# Patient Record
Sex: Male | Born: 1960
Health system: Southern US, Community
[De-identification: ages and names within clinical notes are randomized; demographics above are authoritative.]

## PROBLEM LIST (undated history)

## (undated) DIAGNOSIS — R972 Elevated prostate specific antigen [PSA]: Secondary | ICD-10-CM

## (undated) DIAGNOSIS — N529 Male erectile dysfunction, unspecified: Secondary | ICD-10-CM

## (undated) DIAGNOSIS — E7800 Pure hypercholesterolemia, unspecified: Secondary | ICD-10-CM

## (undated) DIAGNOSIS — E785 Hyperlipidemia, unspecified: Secondary | ICD-10-CM

## (undated) DIAGNOSIS — J40 Bronchitis, not specified as acute or chronic: Secondary | ICD-10-CM

## (undated) HISTORY — DX: Elevated prostate specific antigen (PSA): R97.20

## (undated) HISTORY — DX: Male erectile dysfunction, unspecified: N52.9

## (undated) HISTORY — PX: OTHER SURGICAL HISTORY: SHX169

## (undated) HISTORY — DX: Pure hypercholesterolemia, unspecified: E78.00

## (undated) HISTORY — DX: Hyperlipidemia, unspecified: E78.5

## (undated) HISTORY — PX: COLONOSCOPY WITH PROPOFOL: SHX5780

## (undated) HISTORY — DX: Bronchitis, not specified as acute or chronic: J40

---

## 2011-03-31 DIAGNOSIS — J40 Bronchitis, not specified as acute or chronic: Secondary | ICD-10-CM

## 2011-03-31 HISTORY — DX: Bronchitis, not specified as acute or chronic: J40

## 2014-02-27 ENCOUNTER — Ambulatory Visit: Payer: Self-pay | Admitting: Gastroenterology

## 2014-03-02 LAB — PATHOLOGY REPORT

## 2015-06-28 ENCOUNTER — Encounter: Payer: Self-pay | Admitting: Physician Assistant

## 2015-06-28 ENCOUNTER — Ambulatory Visit: Payer: Self-pay | Admitting: Physician Assistant

## 2015-06-28 VITALS — BP 120/80 | HR 80 | Temp 98.5°F

## 2015-06-28 DIAGNOSIS — R05 Cough: Secondary | ICD-10-CM

## 2015-06-28 DIAGNOSIS — R059 Cough, unspecified: Secondary | ICD-10-CM

## 2015-06-28 MED ORDER — BENZONATATE 200 MG PO CAPS: 200.0000 mg | ORAL_CAPSULE | Freq: Two times a day (BID) | ORAL | Status: DC | PRN

## 2015-06-28 MED ORDER — ALBUTEROL SULFATE HFA 108 (90 BASE) MCG/ACT IN AERS: 2.0000 | INHALATION_SPRAY | Freq: Four times a day (QID) | RESPIRATORY_TRACT | Status: DC | PRN

## 2015-06-28 MED ORDER — FLUTICASONE PROPIONATE 50 MCG/ACT NA SUSP: 2.0000 | Freq: Every day | NASAL | Status: DC

## 2015-06-28 NOTE — Progress Notes (Signed)
S: C/o runny nose and congestion for 14 days, no fever, chills, cp/sob, v/d; mucus was clear this am and throughout the day, cough is sporadic, some wheezing at night when he lies down, hx of seasonal allergies in the fall, takes zyrtec at night, pcp recommended he double up with claritin during the day  Using otc meds: mucinex  O: PE: perrl eomi, normocephalic, tms dull, nasal mucosa red and swollen, throat injected, neck supple no lymph, lungs c t a, cv rrr, neuro intact  A:  Acute viral uri, seasonal allergies   P: drink fluids, continue regular meds , use otc meds of choice, return if not improving in 5 days, return earlier if worsening , tessalon perls, albuterol inhaler, flonase, saline nasal spray, switch to allegra instead of claritin

## 2015-07-29 ENCOUNTER — Encounter: Payer: Self-pay | Admitting: Urology

## 2015-07-29 ENCOUNTER — Other Ambulatory Visit: Payer: Self-pay

## 2015-07-29 ENCOUNTER — Ambulatory Visit (INDEPENDENT_AMBULATORY_CARE_PROVIDER_SITE_OTHER): Payer: 59 | Admitting: Urology

## 2015-07-29 VITALS — BP 123/76 | HR 75 | Ht 69.5 in | Wt 187.5 lb

## 2015-07-29 DIAGNOSIS — N529 Male erectile dysfunction, unspecified: Secondary | ICD-10-CM

## 2015-07-29 DIAGNOSIS — R972 Elevated prostate specific antigen [PSA]: Secondary | ICD-10-CM

## 2015-07-29 DIAGNOSIS — E7800 Pure hypercholesterolemia, unspecified: Secondary | ICD-10-CM | POA: Insufficient documentation

## 2015-07-29 DIAGNOSIS — N400 Enlarged prostate without lower urinary tract symptoms: Secondary | ICD-10-CM

## 2015-07-29 HISTORY — DX: Pure hypercholesterolemia, unspecified: E78.00

## 2015-07-29 HISTORY — DX: Male erectile dysfunction, unspecified: N52.9

## 2015-07-29 LAB — URINALYSIS, COMPLETE
BILIRUBIN UA: NEGATIVE
Glucose, UA: NEGATIVE
KETONES UA: NEGATIVE
LEUKOCYTES UA: NEGATIVE
Nitrite, UA: NEGATIVE
PH UA: 5.5 (ref 5.0–7.5)
PROTEIN UA: NEGATIVE
RBC UA: NEGATIVE
SPEC GRAV UA: 1.025 (ref 1.005–1.030)
Urobilinogen, Ur: 0.2 mg/dL (ref 0.2–1.0)

## 2015-07-29 LAB — MICROSCOPIC EXAMINATION
Bacteria, UA: NONE SEEN
RBC, UA: NONE SEEN /hpf (ref 0–?)

## 2015-07-29 NOTE — Progress Notes (Signed)
07/29/2015 1:49 PM   Tyrone Mccormick February 04, 1961 [id number]  Referring provider: Wayland Salinas, MD [address] [place], [place] [address]  Chief Complaint  Patient presents with  . New Patient (Initial Visit)    elevated PSA    HPI: The patient is a 54 year old gentleman presents for an elevated PSA.  The patient does have a family history of prostate cancer in his paternal cousin. Since of urination symptoms, his disease complaint is he sits to urinate when he stands up he leaks. He otherwise has no major issues with urination. He takes saw palmetto because he read that it "helps prostate symptoms." He does have erectile dysfunction. He currently takes 40 mg of generic sildenafil with a good response.  PSA: June 2015: 1.14 June 2015: 3.14 July 2015:4.3   PMH: Past Medical History  Diagnosis Date  . HLD (hyperlipidemia)   . Elevated PSA     Surgical History: Past Surgical History  Procedure Laterality Date  . None      Home Medications:    Medication List       This list is accurate as of: 07/29/15  1:49 PM.  Always use your most recent med list.               aspirin 81 MG tablet  Take 81 mg by mouth daily.     atorvastatin 20 MG tablet  Commonly known as:  LIPITOR  Take 20 mg by mouth daily.     cetirizine 10 MG tablet  Commonly known as:  ZYRTEC  Take 10 mg by mouth daily.     ibuprofen 200 MG tablet  Commonly known as:  ADVIL,MOTRIN  Take 200 mg by mouth every 6 (six) hours as needed.     MELATONIN PO  Take by mouth daily.     multivitamin tablet  Take 1 tablet by mouth daily.     saw palmetto 80 MG capsule  Take 80 mg by mouth 2 (two) times daily.     sildenafil 25 MG tablet  Commonly known as:  VIAGRA  Take 25 mg by mouth daily as needed for erectile dysfunction.        Allergies: No Known Allergies  Family History: Family History  Problem Relation Age of Onset  . Prostate cancer Cousin   .  Prostate cancer      grandfather    Social History:  reports that he has never smoked. He does not have any smokeless tobacco history on file. He reports that he drinks alcohol. He reports that he does not use illicit drugs.  ROS: UROLOGY Frequent Urination?: Yes Hard to postpone urination?: Yes Burning/pain with urination?: No Get up at night to urinate?: Yes Leakage of urine?: Yes Urine stream starts and stops?: No Trouble starting stream?: No Do you have to strain to urinate?: No Blood in urine?: No Urinary tract infection?: No Sexually transmitted disease?: No Injury to kidneys or bladder?: No Painful intercourse?: No Weak stream?: No Erection problems?: Yes Penile pain?: No  Gastrointestinal Nausea?: No Vomiting?: No Indigestion/heartburn?: No Diarrhea?: No Constipation?: No  Constitutional Fever: No Night sweats?: No Weight loss?: No Fatigue?: No  Skin Skin rash/lesions?: No Itching?: No  Eyes Blurred vision?: No Double vision?: No  Ears/Nose/Throat Sore throat?: No Sinus problems?: No  Hematologic/Lymphatic Swollen glands?: No Easy bruising?: No  Cardiovascular Leg swelling?: No Chest pain?: No  Respiratory Cough?: No Shortness of breath?: No  Endocrine Excessive thirst?: No  Musculoskeletal Back  pain?: No Joint pain?: No  Neurological Headaches?: No Dizziness?: No  Psychologic Depression?: No Anxiety?: No  Physical Exam: BP 123/76 mmHg  Pulse 75  Ht 5' 9.5" (1.765 m)  Wt 187 lb 8 oz (85.049 kg)  BMI 27.30 kg/m2  Constitutional:  Alert and oriented, No acute distress. HEENT: [place] AT, moist mucus membranes.  Trachea midline, no masses. Cardiovascular: No clubbing, cyanosis, or edema. Respiratory: Normal respiratory effort, no increased work of breathing. GI: Abdomen is soft, nontender, nondistended, no abdominal masses GU: No CVA tenderness.  Normal phallus. Testicles ascended bilaterally. Nontender palpation. DRE: 2+,  smooth, no nodules. Skin: No rashes, bruises or suspicious lesions. Lymph: No cervical or inguinal adenopathy. Neurologic: Grossly intact, no focal deficits, moving all 4 extremities. Psychiatric: Normal mood and affect.  Laboratory Data: No results found for: WBC, HGB, HCT, MCV, PLT  No results found for: CREATININE  No results found for: PSA  No results found for: TESTOSTERONE  No results found for: HGBA1C  Urinalysis No results found for: COLORURINE, APPEARANCEUR, LABSPEC, PHURINE, GLUCOSEU, HGBUR, BILIRUBINUR, KETONESUR, PROTEINUR, UROBILINOGEN, NITRITE, LEUKOCYTESUR   Assessment & Plan:    I discussed the patient the risks, benefits, and indications of prostate biopsy. He understands the risks include bleeding and infection. He understands that there'll be blood in his urine, semen, and and stool. I also discussed the risks, benefits, and indications of PSA testing. He is aware that is a controversial tests with some guidelines recommend against testing. He understands it may lead to the over diagnosis and over treatment of prostate cancer. He understands that he has increased risks because of his young age and his PSA doubling time being less than 18 months.  His current PSA is also 4.3. He had all questions answered. He has elected to undergo prostate biopsy.  1. Elevated PSA -Prostate biopsy  2. Erectile dysfunction Continue generic Benadryl 40 mg as prescribed by the PCP next  3. BPH without significant urinary symptoms No medical treatment indicated at this time.  Return in about 2 weeks (around 08/12/2015) for prostate biopsy.  Nickie Retort, MD  [facility] [address] [place], [place] [address] [phone]

## 2015-08-03 ENCOUNTER — Telehealth: Payer: Self-pay | Admitting: Urology

## 2015-08-03 NOTE — Telephone Encounter (Signed)
Patient notified that we received clearance from Dr. Owens Shark for him to stop ASA 81mg  7 days prior to prostate biopsy scheduled for 08-18-15@3 :30pm. Patient verbalized understanding and pre instructions were also reviewed again with him, patient understands

## 2015-08-03 NOTE — Telephone Encounter (Signed)
Left pt mess to call in regards to clearance for prostate biopsy

## 2015-08-18 ENCOUNTER — Ambulatory Visit (INDEPENDENT_AMBULATORY_CARE_PROVIDER_SITE_OTHER): Payer: 59 | Admitting: Urology

## 2015-08-18 ENCOUNTER — Other Ambulatory Visit: Payer: Self-pay | Admitting: Urology

## 2015-08-18 ENCOUNTER — Encounter: Payer: Self-pay | Admitting: Urology

## 2015-08-18 VITALS — BP 123/75 | HR 66 | Ht 69.0 in | Wt 192.8 lb

## 2015-08-18 DIAGNOSIS — R972 Elevated prostate specific antigen [PSA]: Secondary | ICD-10-CM | POA: Diagnosis not present

## 2015-08-18 MED ORDER — GENTAMICIN SULFATE 40 MG/ML IJ SOLN
80.0000 mg | Freq: Once | INTRAMUSCULAR | Status: AC
  Administered 2015-08-18: 80 mg via INTRAMUSCULAR

## 2015-08-18 MED ORDER — LEVOFLOXACIN 500 MG PO TABS
500.0000 mg | ORAL_TABLET | Freq: Once | ORAL | Status: AC
  Administered 2015-08-18: 500 mg via ORAL

## 2015-08-18 NOTE — Progress Notes (Signed)
Prostate Biopsy Procedure   Informed consent was obtained after discussing risks/benefits of the procedure.  A time out was performed to ensure correct patient identity.  Pre-Procedure: - Last PSA Level: 4.3 - Gentamicin given prophylactically - Levaquin 500 mg administered PO -Transrectal Ultrasound performed revealing a 47.91 gm prostate -No significant hypoechoic or median lobe noted  Procedure: - Prostate block performed using 10 cc 1% lidocaine and biopsies taken from sextant areas, a total of 12 under ultrasound guidance. -DRE post procedure with no significant bleeding or hematoma  Post-Procedure: - Patient tolerated the procedure well - He was counseled to seek immediate medical attention if experiences any severe pain, significant bleeding, or fevers - Return in one week to discuss biopsy results

## 2015-08-23 LAB — PATHOLOGY REPORT

## 2015-08-27 ENCOUNTER — Encounter: Payer: Self-pay | Admitting: Urology

## 2015-09-03 ENCOUNTER — Ambulatory Visit: Payer: 59

## 2016-02-18 ENCOUNTER — Other Ambulatory Visit: Payer: Self-pay

## 2016-02-18 DIAGNOSIS — R972 Elevated prostate specific antigen [PSA]: Secondary | ICD-10-CM

## 2016-02-21 ENCOUNTER — Other Ambulatory Visit: Payer: 59

## 2016-02-21 DIAGNOSIS — R972 Elevated prostate specific antigen [PSA]: Secondary | ICD-10-CM | POA: Diagnosis not present

## 2016-02-22 LAB — PSA: Prostate Specific Ag, Serum: 5.2 ng/mL — ABNORMAL HIGH (ref 0.0–4.0)

## 2016-02-24 ENCOUNTER — Ambulatory Visit: Payer: 59

## 2016-02-25 ENCOUNTER — Ambulatory Visit (INDEPENDENT_AMBULATORY_CARE_PROVIDER_SITE_OTHER): Payer: 59 | Admitting: Urology

## 2016-02-25 ENCOUNTER — Encounter: Payer: Self-pay | Admitting: Urology

## 2016-02-25 VITALS — BP 98/59 | HR 78 | Ht 69.75 in | Wt 189.5 lb

## 2016-02-25 DIAGNOSIS — R972 Elevated prostate specific antigen [PSA]: Secondary | ICD-10-CM | POA: Diagnosis not present

## 2016-02-25 NOTE — Progress Notes (Signed)
02/25/2016 4:13 PM   Tyrone Mccormick 1960-11-17 [id number]  Referring provider: Wayland Salinas, MD [address] [place], [place] [address]  Chief Complaint  Patient presents with  . Follow-up    6 month PSA 5.2    HPI: The patient is a 55 year old gentleman presents today for evaluation of elevated PSA. He did negative prostate biopsy in January 2017. His PSA has since risen to 5.2 from 4.3. He did have chronic inflammation noted on the prostate biopsy.   June 2015: 1.14 June 2015: 3.14 July 2015:4.07 February 2016: 5.2   PMH: Past Medical History  Diagnosis Date  . HLD (hyperlipidemia)   . Elevated PSA   . Bronchitis 03/31/2011  . ED (erectile dysfunction) of organic origin 07/29/2015  . Hypercholesterolemia 07/29/2015    Surgical History: Past Surgical History  Procedure Laterality Date  . None      Home Medications:    Medication List       This list is accurate as of: 02/25/16  4:13 PM.  Always use your most recent med list.               aspirin 81 MG tablet  Take 81 mg by mouth daily. Reported on 08/18/2015     atorvastatin 40 MG tablet  Commonly known as:  LIPITOR     cetirizine 10 MG tablet  Commonly known as:  ZYRTEC  Take 10 mg by mouth daily. Reported on 08/18/2015     ibuprofen 200 MG tablet  Commonly known as:  ADVIL,MOTRIN  Take 200 mg by mouth every 6 (six) hours as needed. Reported on 08/18/2015     MELATONIN PO  Take by mouth daily.     multivitamin tablet  Take 1 tablet by mouth daily.     saw palmetto 80 MG capsule  Take 80 mg by mouth 2 (two) times daily. Reported on 02/25/2016     sildenafil 25 MG tablet  Commonly known as:  VIAGRA  Take 25 mg by mouth daily as needed for erectile dysfunction.     tadalafil 20 MG tablet  Commonly known as:  CIALIS  Take by mouth. Reported on 02/25/2016        Allergies: No Known Allergies  Family History: Family History  Problem Relation Age of Onset  .  Prostate cancer Cousin   . Prostate cancer      grandfather  . Kidney disease Neg Hx     Social History:  reports that he has never smoked. He does not have any smokeless tobacco history on file. He reports that he drinks alcohol. He reports that he does not use illicit drugs.  ROS: UROLOGY Frequent Urination?: No Hard to postpone urination?: No Burning/pain with urination?: No Get up at night to urinate?: No Leakage of urine?: No Urine stream starts and stops?: No Trouble starting stream?: No Do you have to strain to urinate?: No Blood in urine?: No Urinary tract infection?: No Sexually transmitted disease?: No Injury to kidneys or bladder?: No Painful intercourse?: No Weak stream?: No Erection problems?: No Penile pain?: No  Gastrointestinal Nausea?: No Vomiting?: No Indigestion/heartburn?: No Diarrhea?: No Constipation?: No  Constitutional Fever: No Night sweats?: No Weight loss?: No Fatigue?: No  Skin Skin rash/lesions?: No Itching?: No  Eyes Blurred vision?: No Double vision?: No  Ears/Nose/Throat Sore throat?: No Sinus problems?: No  Hematologic/Lymphatic Swollen glands?: No Easy bruising?: No  Cardiovascular Leg swelling?: No Chest pain?: No  Respiratory Cough?: No Shortness  of breath?: No  Endocrine Excessive thirst?: No  Musculoskeletal Back pain?: No Joint pain?: No  Neurological Headaches?: No Dizziness?: No  Psychologic Depression?: No Anxiety?: No  Physical Exam: BP 98/59 mmHg  Pulse 78  Ht 5' 9.75" (1.772 m)  Wt 189 lb 8 oz (85.957 kg)  BMI 27.37 kg/m2  Constitutional:  Alert and oriented, No acute distress. HEENT: [place] AT, moist mucus membranes.  Trachea midline, no masses. Cardiovascular: No clubbing, cyanosis, or edema. Respiratory: Normal respiratory effort, no increased work of breathing. GI: Abdomen is soft, nontender, nondistended, no abdominal masses GU: No CVA tenderness.  Skin: No rashes, bruises or  suspicious lesions. Lymph: No cervical or inguinal adenopathy. Neurologic: Grossly intact, no focal deficits, moving all 4 extremities. Psychiatric: Normal mood and affect.  Laboratory Data: No results found for: WBC, HGB, HCT, MCV, PLT  No results found for: CREATININE  No results found for: PSA  No results found for: TESTOSTERONE  No results found for: HGBA1C  Urinalysis    Component Value Date/Time   APPEARANCEUR Clear 07/29/2015 1330   GLUCOSEU Negative 07/29/2015 1330   BILIRUBINUR Negative 07/29/2015 1330   PROTEINUR Negative 07/29/2015 1330   NITRITE Negative 07/29/2015 1330   LEUKOCYTESUR Negative 07/29/2015 1330      Assessment & Plan:    1. Elevated PSA 2. Rising PSA The patient's PSA continues to rise despite a negative prostate biopsy. We discussed options which include repeating the PSA in 6 months, endorectal MRI of the prostate with potential fusion prostate biopsy if suspicious area, or repeat prostate biopsy. The patient has elected to undergo an MRI of the prostate. I think this is very reasonable given in his previous negative prostate biopsy, young age, and rising PSA after this biopsy. He will follow-up after undergoing this procedure.    Return for after endorectal MRI in [place].  Nickie Retort, MD  [facility] [address] [place], [place] [address] [phone]

## 2016-03-22 ENCOUNTER — Ambulatory Visit (HOSPITAL_COMMUNITY)
Admission: RE | Admit: 2016-03-22 | Discharge: 2016-03-22 | Disposition: A | Discharge status: Home / Self Care | Payer: 59 | Source: Ambulatory Visit | Attending: Urology | Admitting: Urology

## 2016-03-22 DIAGNOSIS — R972 Elevated prostate specific antigen [PSA]: Secondary | ICD-10-CM | POA: Diagnosis not present

## 2016-03-22 DIAGNOSIS — R9721 Rising PSA following treatment for malignant neoplasm of prostate: Secondary | ICD-10-CM | POA: Diagnosis not present

## 2016-03-22 MED ORDER — GADOBENATE DIMEGLUMINE 529 MG/ML IV SOLN
17.0000 mL | Freq: Once | INTRAVENOUS | Status: AC | PRN
  Administered 2016-03-22: 17 mL via INTRAVENOUS

## 2016-04-03 ENCOUNTER — Telehealth: Payer: Self-pay | Admitting: Urology

## 2016-04-03 NOTE — Telephone Encounter (Signed)
Patient called and said that he went over and signed for his MRI results and that he knows that they are negative and that he is satisfied with them and doesn't feel that he needs to come in to discuss them. He doesn't have any questions about them. 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 He wants to cancel his results appointment. He said thank you for everything.   Sharyn Lull

## 2016-04-06 ENCOUNTER — Ambulatory Visit: Payer: 59

## 2016-04-06 NOTE — Telephone Encounter (Signed)
Tyrone Mccormick. Patient should still have annual PSA and DRE scheduled for August 2018.

## 2016-06-22 DIAGNOSIS — E7800 Pure hypercholesterolemia, unspecified: Secondary | ICD-10-CM | POA: Diagnosis not present

## 2016-06-22 DIAGNOSIS — Z1159 Encounter for screening for other viral diseases: Secondary | ICD-10-CM | POA: Diagnosis not present

## 2016-06-22 DIAGNOSIS — Z0000 Encounter for general adult medical examination without abnormal findings: Secondary | ICD-10-CM | POA: Diagnosis not present

## 2016-06-22 DIAGNOSIS — R972 Elevated prostate specific antigen [PSA]: Secondary | ICD-10-CM | POA: Diagnosis not present

## 2016-11-20 ENCOUNTER — Ambulatory Visit: Payer: Self-pay | Admitting: Physician Assistant

## 2016-11-20 ENCOUNTER — Ambulatory Visit
Admission: RE | Admit: 2016-11-20 | Discharge: 2016-11-20 | Disposition: A | Discharge status: Home / Self Care | Payer: 59 | Source: Ambulatory Visit | Attending: Physician Assistant | Admitting: Physician Assistant

## 2016-11-20 VITALS — BP 129/70 | HR 80 | Temp 97.7°F

## 2016-11-20 DIAGNOSIS — N451 Epididymitis: Secondary | ICD-10-CM | POA: Insufficient documentation

## 2016-11-20 DIAGNOSIS — N50812 Left testicular pain: Secondary | ICD-10-CM

## 2016-11-20 LAB — POCT URINALYSIS DIPSTICK
Bilirubin, UA: NEGATIVE
GLUCOSE UA: NEGATIVE
Ketones, UA: NEGATIVE
Leukocytes, UA: NEGATIVE
Nitrite, UA: NEGATIVE
PROTEIN UA: NEGATIVE
RBC UA: NEGATIVE
UROBILINOGEN UA: 0.2 U/dL
pH, UA: 6 (ref 5.0–8.0)

## 2016-11-20 MED ORDER — CIPROFLOXACIN HCL 500 MG PO TABS: 500.0000 mg | ORAL_TABLET | Freq: Two times a day (BID) | ORAL | 0 refills | Status: DC

## 2016-11-20 NOTE — Addendum Note (Signed)
Addended by: Versie Starks on: 11/20/2016 04:59 PM   Modules accepted: Orders

## 2016-11-20 NOTE — Progress Notes (Addendum)
S: c/o left testicular pain, states he awoke on Sunday with an enlarged testicle, on Saturday he did a lot of yard work and was bending and squatting while trimming some areas, no fever/chills, some low back pain, just knows it doesn't feel right  O: vitals wnl, left testicle is hard at lower pole, tender in inguinal canal, r testicle wnl, n/v intact  A: testicular pain  P: [place] of testicle, if neg for mass will refer to surgery for hernia US shows epididymitis; called in cipro 500mg  bid for 2 weeks

## 2016-12-11 ENCOUNTER — Ambulatory Visit: Payer: Self-pay | Admitting: Physician Assistant

## 2017-07-04 DIAGNOSIS — E7800 Pure hypercholesterolemia, unspecified: Secondary | ICD-10-CM | POA: Diagnosis not present

## 2017-07-04 DIAGNOSIS — Z0000 Encounter for general adult medical examination without abnormal findings: Secondary | ICD-10-CM | POA: Diagnosis not present

## 2017-07-04 DIAGNOSIS — R972 Elevated prostate specific antigen [PSA]: Secondary | ICD-10-CM | POA: Diagnosis not present

## 2017-07-04 DIAGNOSIS — Z79899 Other long term (current) drug therapy: Secondary | ICD-10-CM | POA: Diagnosis not present

## 2018-01-02 DIAGNOSIS — D179 Benign lipomatous neoplasm, unspecified: Secondary | ICD-10-CM | POA: Diagnosis not present

## 2018-01-02 DIAGNOSIS — L309 Dermatitis, unspecified: Secondary | ICD-10-CM | POA: Diagnosis not present

## 2018-01-30 DIAGNOSIS — L308 Other specified dermatitis: Secondary | ICD-10-CM | POA: Diagnosis not present

## 2018-01-30 DIAGNOSIS — L209 Atopic dermatitis, unspecified: Secondary | ICD-10-CM | POA: Diagnosis not present

## 2018-01-30 DIAGNOSIS — L400 Psoriasis vulgaris: Secondary | ICD-10-CM | POA: Diagnosis not present

## 2018-07-09 DIAGNOSIS — Z0000 Encounter for general adult medical examination without abnormal findings: Secondary | ICD-10-CM | POA: Diagnosis not present

## 2018-07-09 DIAGNOSIS — E7800 Pure hypercholesterolemia, unspecified: Secondary | ICD-10-CM | POA: Diagnosis not present

## 2018-07-09 DIAGNOSIS — Z23 Encounter for immunization: Secondary | ICD-10-CM | POA: Diagnosis not present

## 2018-07-09 DIAGNOSIS — Z79899 Other long term (current) drug therapy: Secondary | ICD-10-CM | POA: Diagnosis not present

## 2018-07-17 DIAGNOSIS — L308 Other specified dermatitis: Secondary | ICD-10-CM | POA: Diagnosis not present

## 2018-07-17 DIAGNOSIS — L738 Other specified follicular disorders: Secondary | ICD-10-CM | POA: Diagnosis not present

## 2018-12-04 ENCOUNTER — Ambulatory Visit: Payer: Self-pay | Admitting: Family Medicine

## 2019-07-17 ENCOUNTER — Other Ambulatory Visit: Payer: Self-pay

## 2019-07-17 ENCOUNTER — Other Ambulatory Visit
Admission: RE | Admit: 2019-07-17 | Discharge: 2019-07-17 | Disposition: A | Discharge status: Home / Self Care | Payer: No Typology Code available for payment source | Source: Ambulatory Visit | Attending: Internal Medicine | Admitting: Internal Medicine

## 2019-07-17 DIAGNOSIS — Z01812 Encounter for preprocedural laboratory examination: Secondary | ICD-10-CM | POA: Diagnosis present

## 2019-07-17 DIAGNOSIS — Z20828 Contact with and (suspected) exposure to other viral communicable diseases: Secondary | ICD-10-CM | POA: Insufficient documentation

## 2019-07-17 LAB — SARS CORONAVIRUS 2 (TAT 6-24 HRS): SARS Coronavirus 2: NEGATIVE

## 2019-07-18 ENCOUNTER — Encounter: Payer: Self-pay | Admitting: Internal Medicine

## 2019-07-21 ENCOUNTER — Other Ambulatory Visit: Payer: Self-pay

## 2019-07-21 ENCOUNTER — Ambulatory Visit: Payer: No Typology Code available for payment source | Admitting: Anesthesiology

## 2019-07-21 ENCOUNTER — Encounter
Admission: RE | Disposition: A | Discharge status: Home / Self Care | Payer: Self-pay | Source: Home / Self Care | Attending: Internal Medicine

## 2019-07-21 ENCOUNTER — Ambulatory Visit
Admission: RE | Admit: 2019-07-21 | Discharge: 2019-07-21 | Disposition: A | Discharge status: Home / Self Care | Payer: No Typology Code available for payment source | Attending: Internal Medicine | Admitting: Internal Medicine

## 2019-07-21 ENCOUNTER — Encounter: Payer: Self-pay | Admitting: Internal Medicine

## 2019-07-21 DIAGNOSIS — Z86010 Personal history of colonic polyps: Secondary | ICD-10-CM | POA: Insufficient documentation

## 2019-07-21 DIAGNOSIS — Z7982 Long term (current) use of aspirin: Secondary | ICD-10-CM | POA: Insufficient documentation

## 2019-07-21 DIAGNOSIS — E785 Hyperlipidemia, unspecified: Secondary | ICD-10-CM | POA: Diagnosis not present

## 2019-07-21 DIAGNOSIS — D122 Benign neoplasm of ascending colon: Secondary | ICD-10-CM | POA: Insufficient documentation

## 2019-07-21 DIAGNOSIS — K5730 Diverticulosis of large intestine without perforation or abscess without bleeding: Secondary | ICD-10-CM | POA: Diagnosis not present

## 2019-07-21 DIAGNOSIS — K635 Polyp of colon: Secondary | ICD-10-CM | POA: Insufficient documentation

## 2019-07-21 DIAGNOSIS — E7800 Pure hypercholesterolemia, unspecified: Secondary | ICD-10-CM | POA: Insufficient documentation

## 2019-07-21 DIAGNOSIS — K640 First degree hemorrhoids: Secondary | ICD-10-CM | POA: Insufficient documentation

## 2019-07-21 DIAGNOSIS — Z1211 Encounter for screening for malignant neoplasm of colon: Secondary | ICD-10-CM | POA: Diagnosis not present

## 2019-07-21 DIAGNOSIS — N529 Male erectile dysfunction, unspecified: Secondary | ICD-10-CM | POA: Diagnosis not present

## 2019-07-21 HISTORY — PX: COLONOSCOPY WITH PROPOFOL: SHX5780

## 2019-07-21 SURGERY — COLONOSCOPY WITH PROPOFOL
Anesthesia: General

## 2019-07-21 MED ORDER — PROPOFOL 10 MG/ML IV BOLUS
INTRAVENOUS | Status: DC | PRN
  Administered 2019-07-21: 60 mg via INTRAVENOUS
  Administered 2019-07-21: 30 mg via INTRAVENOUS

## 2019-07-21 MED ORDER — LIDOCAINE HCL (CARDIAC) PF 100 MG/5ML IV SOSY
PREFILLED_SYRINGE | INTRAVENOUS | Status: DC | PRN
  Administered 2019-07-21: 60 mg via INTRAVENOUS

## 2019-07-21 MED ORDER — PROPOFOL 500 MG/50ML IV EMUL
INTRAVENOUS | Status: DC | PRN
  Administered 2019-07-21: 120 ug/kg/min via INTRAVENOUS

## 2019-07-21 MED ORDER — SODIUM CHLORIDE 0.9 % IV SOLN
INTRAVENOUS | Status: DC
  Administered 2019-07-21: 1000 mL via INTRAVENOUS

## 2019-07-21 MED ORDER — PROPOFOL 10 MG/ML IV BOLUS
INTRAVENOUS | Status: AC
  Filled 2019-07-21: qty 20

## 2019-07-21 NOTE — H&P (Signed)
Outpatient short stay form Pre-procedure 07/21/2019 1:32 PM Tyrone Mccormick K. Alice Reichert, M.D.  Primary Physician: Orpah Melter, M.D.  Reason for visit:  Personal hx of adenomatous colon polyp x 3 (July 2015 colonoscopy).  History of present illness:                            Patient presents for colonoscopy for a personal hx of colon polyps. The patient denies abdominal pain, abnormal weight loss or rectal bleeding.     Current Facility-Administered Medications:  .  0.9 %  sodium chloride infusion, , Intravenous, Continuous, Kimmi Acocella, Benay Pike, MD  Medications Prior to Admission  Medication Sig Dispense Refill Last Dose  . aspirin 81 MG tablet Take 81 mg by mouth daily. Reported on 08/18/2015   Past Week at Unknown time  . atorvastatin (LIPITOR) 40 MG tablet   3 Past Week at Unknown time  . cetirizine (ZYRTEC) 10 MG tablet Take 10 mg by mouth daily. Reported on 08/18/2015   Past Week at Unknown time  . MELATONIN PO Take by mouth daily.   07/20/2019 at Unknown time  . Multiple Vitamin (MULTIVITAMIN) tablet Take 1 tablet by mouth daily.   Past Week at Unknown time  . saw palmetto 80 MG capsule Take 80 mg by mouth 2 (two) times daily. Reported on 02/25/2016   Past Week at Unknown time  . ibuprofen (ADVIL,MOTRIN) 200 MG tablet Take 200 mg by mouth every 6 (six) hours as needed. Reported on 08/18/2015     . sildenafil (VIAGRA) 25 MG tablet Take 25 mg by mouth daily as needed for erectile dysfunction.        No Known Allergies   Past Medical History:  Diagnosis Date  . Bronchitis 03/31/2011  . ED (erectile dysfunction) of organic origin 07/29/2015  . Elevated PSA   . HLD (hyperlipidemia)   . Hypercholesterolemia 07/29/2015    Review of systems:  Otherwise negative.    Physical Exam  Gen: Alert, oriented. Appears stated age.  HEENT: [place]/AT. PERRLA. Lungs: CTA, no wheezes. CV: RR nl S1, S2. Abd: soft, benign, no masses. BS+ Ext: No edema. Pulses 2+    Planned procedures: Proceed with  colonoscopy. The patient understands the nature of the planned procedure, indications, risks, alternatives and potential complications including but not limited to bleeding, infection, perforation, damage to internal organs and possible oversedation/side effects from anesthesia. The patient agrees and gives consent to proceed.  Please refer to procedure notes for findings, recommendations and patient disposition/instructions.     Chi Garlow K. Alice Reichert, M.D. Gastroenterology 07/21/2019  1:32 PM

## 2019-07-21 NOTE — Anesthesia Preprocedure Evaluation (Signed)
Anesthesia Evaluation  Patient identified by MRN, date of birth, ID band Patient awake    Reviewed: Allergy & Precautions, H&P , NPO status , Patient's Chart, lab work & pertinent test results, reviewed documented beta blocker date and time   History of Anesthesia Complications (+) PROLONGED EMERGENCE and history of anesthetic complications  Airway Mallampati: I  TM Distance: >3 FB Neck ROM: full    Dental  (+) Dental Advidsory Given, Caps, Teeth Intact   Pulmonary neg pulmonary ROS,    Pulmonary exam normal        Cardiovascular Exercise Tolerance: Good negative cardio ROS Normal cardiovascular exam     Neuro/Psych negative neurological ROS  negative psych ROS   GI/Hepatic negative GI ROS, Neg liver ROS,   Endo/Other  negative endocrine ROS  Renal/GU negative Renal ROS  negative genitourinary   Musculoskeletal   Abdominal   Peds  Hematology negative hematology ROS (+)   Anesthesia Other Findings Past Medical History: 03/31/2011: Bronchitis 07/29/2015: ED (erectile dysfunction) of organic origin No date: Elevated PSA No date: HLD (hyperlipidemia) 07/29/2015: Hypercholesterolemia   Reproductive/Obstetrics negative OB ROS                             Anesthesia Physical Anesthesia Plan  ASA: II  Anesthesia Plan: General   Post-op Pain Management:    Induction: Intravenous  PONV Risk Score and Plan: 2 and Propofol infusion and TIVA  Airway Management Planned: Natural Airway and Nasal Cannula  Additional Equipment:   Intra-op Plan:   Post-operative Plan:   Informed Consent: I have reviewed the patients History and Physical, chart, labs and discussed the procedure including the risks, benefits and alternatives for the proposed anesthesia with the patient or authorized representative who has indicated his/her understanding and acceptance.     Dental Advisory Given  Plan  Discussed with: Anesthesiologist, CRNA and Surgeon  Anesthesia Plan Comments:         Anesthesia Quick Evaluation

## 2019-07-21 NOTE — Interval H&P Note (Signed)
History and Physical Interval Note:  07/21/2019 1:33 PM  Tyrone Mccormick  has presented today for surgery, with the diagnosis of PERSONAL HX.OF COLON POLYPS.  The various methods of treatment have been discussed with the patient and family. After consideration of risks, benefits and other options for treatment, the patient has consented to  Procedure(s): COLONOSCOPY WITH PROPOFOL (N/A) as a surgical intervention.  The patient's history has been reviewed, patient examined, no change in status, stable for surgery.  I have reviewed the patient's chart and labs.  Questions were answered to the patient's satisfaction.     [place], [place]

## 2019-07-21 NOTE — Op Note (Signed)
[facility] Patient Name: Tyrone Mccormick Procedure Date: 07/21/2019 2:10 PM MRN: [id number] Account #: [id number] Date of Birth: Feb 18, 1961 Admit Type: Outpatient Age: 58 Room: [facility] ENDO ROOM 1 Gender: Male Note Status: Finalized Procedure:             Colonoscopy Indications:           Surveillance: Personal history of adenomatous polyps                         on last colonoscopy > 5 years ago Providers:             Lorie Apley K. Alice Reichert MD, MD Referring MD:          No Local Md, MD (Referring MD) Medicines:             Propofol per Anesthesia Complications:         No immediate complications. Procedure:             Pre-Anesthesia Assessment:                        - The risks and benefits of the procedure and the                         sedation options and risks were discussed with the                         patient. All questions were answered and informed                         consent was obtained.                        - Patient identification and proposed procedure were                         verified prior to the procedure by the nurse. The                         procedure was verified in the procedure room.                        - ASA Grade Assessment: III - A patient with severe                         systemic disease.                        - After reviewing the risks and benefits, the patient                         was deemed in satisfactory condition to undergo the                         procedure.                        After obtaining informed consent, the colonoscope was                         passed under direct  vision. Throughout the procedure,                         the patient's blood pressure, pulse, and oxygen                         saturations were monitored continuously. The                         Colonoscope was introduced through the anus and                         advanced to the the cecum, identified by  appendiceal                         orifice and ileocecal valve. The colonoscopy was                         performed without difficulty. The patient tolerated                         the procedure well. The quality of the bowel                         preparation was good. The ileocecal valve, appendiceal                         orifice, and rectum were photographed. Findings:      The perianal and digital rectal examinations were normal. Pertinent       negatives include normal sphincter tone and no palpable rectal lesions.      Many small-mouthed diverticula were found in the sigmoid colon.      A 7 mm polyp was found in the ascending colon. The polyp was sessile.       The polyp was removed with a cold snare. Resection and retrieval were       complete.      A 4 mm polyp was found in the ascending colon. The polyp was sessile.       The polyp was removed with a jumbo cold forceps. Resection and retrieval       were complete.      Non-bleeding internal hemorrhoids were found during retroflexion. The       hemorrhoids were Grade I (internal hemorrhoids that do not prolapse).      The exam was otherwise without abnormality. Impression:            - Diverticulosis in the sigmoid colon.                        - One 7 mm polyp in the ascending colon, removed with                         a cold snare. Resected and retrieved.                        - One 4 mm polyp in the ascending colon, removed with                         a jumbo cold forceps. Resected  and retrieved.                        - Non-bleeding internal hemorrhoids.                        - The examination was otherwise normal. Recommendation:        - Patient has a contact number available for                         emergencies. The signs and symptoms of potential                         delayed complications were discussed with the patient.                         Return to normal activities tomorrow. Written                          discharge instructions were provided to the patient.                        - Resume previous diet.                        - Continue present medications.                        - Repeat colonoscopy in 5 years for surveillance.                        - Return to GI office PRN.                        - The findings and recommendations were discussed with                         the patient. Procedure Code(s):     --- Professional ---                        [id number], Colonoscopy, flexible; with removal of                         tumor(s), polyp(s), or other lesion(s) by snare                         technique                        45380, 44, Colonoscopy, flexible; with biopsy, single                         or multiple Diagnosis Code(s):     --- Professional ---                        K57.30, Diverticulosis of large intestine without                         perforation or abscess without bleeding  K63.5, Polyp of colon                        K64.0, First degree hemorrhoids                        Z86.010, Personal history of colonic polyps CPT copyright 2019 American Medical Association. All rights reserved. The codes documented in this report are preliminary and upon coder review may  be revised to meet current compliance requirements. Efrain Sella MD, MD 07/21/2019 2:38:08 PM This report has been signed electronically. Number of Addenda: 0 Note Initiated On: 07/21/2019 2:10 PM Scope Withdrawal Time: 0 hours 8 minutes 28 seconds  Total Procedure Duration: 0 hours 11 minutes 21 seconds  Estimated Blood Loss:  Estimated blood loss: none.      [facility]

## 2019-07-21 NOTE — Transfer of Care (Signed)
Immediate Anesthesia Transfer of Care Note  Patient: Tyrone Mccormick  Procedure(s) Performed: COLONOSCOPY WITH PROPOFOL (N/A )  Patient Location: PACU  Anesthesia Type:General  Level of Consciousness: sedated  Airway & Oxygen Therapy: Patient Spontanous Breathing and Patient connected to nasal cannula oxygen  Post-op Assessment: Report given to RN and Post -op Vital signs reviewed and stable  Post vital signs: Reviewed and stable  Last Vitals:  Vitals Value Taken Time  BP 106/74 07/21/19 1440  Temp 36.1 C 07/21/19 1440  Pulse 85 07/21/19 1440  Resp 20 07/21/19 1440  SpO2 97 % 07/21/19 1440    Last Pain:  Vitals:   07/21/19 1440  TempSrc: Temporal  PainSc: Asleep         Complications: No apparent anesthesia complications

## 2019-07-21 NOTE — Anesthesia Post-op Follow-up Note (Signed)
Anesthesia QCDR form completed.        

## 2019-07-22 ENCOUNTER — Encounter: Payer: Self-pay | Admitting: *Deleted

## 2019-07-22 NOTE — Anesthesia Postprocedure Evaluation (Signed)
Anesthesia Post Note  Patient: Tyrone Mccormick  Procedure(s) Performed: COLONOSCOPY WITH PROPOFOL (N/A )  Patient location during evaluation: Endoscopy Anesthesia Type: General Level of consciousness: awake and alert Pain management: pain level controlled Vital Signs Assessment: post-procedure vital signs reviewed and stable Respiratory status: spontaneous breathing, nonlabored ventilation, respiratory function stable and patient connected to nasal cannula oxygen Cardiovascular status: blood pressure returned to baseline and stable Postop Assessment: no apparent nausea or vomiting Anesthetic complications: no     Last Vitals:  Vitals:   07/21/19 1450 07/21/19 1500  BP: 111/83 120/83  Pulse: 86 82  Resp: 20 18  Temp:    SpO2: 97% 98%    Last Pain:  Vitals:   07/21/19 1500  TempSrc:   PainSc: 0-No pain                 Martha Clan

## 2019-07-23 LAB — SURGICAL PATHOLOGY

## 2020-02-16 ENCOUNTER — Other Ambulatory Visit: Payer: Self-pay | Admitting: Family Medicine

## 2020-03-17 ENCOUNTER — Ambulatory Visit (INDEPENDENT_AMBULATORY_CARE_PROVIDER_SITE_OTHER): Payer: 59 | Admitting: Dermatology

## 2020-03-17 ENCOUNTER — Other Ambulatory Visit: Payer: Self-pay

## 2020-03-17 DIAGNOSIS — L309 Dermatitis, unspecified: Secondary | ICD-10-CM

## 2020-03-17 MED ORDER — OTEZLA 30 MG PO TABS: 30.0000 mg | ORAL_TABLET | Freq: Two times a day (BID) | ORAL | 4 refills | Status: DC

## 2020-03-17 MED ORDER — TRIAMCINOLONE ACETONIDE 0.1 % EX CREA: 1.0000 "application " | TOPICAL_CREAM | CUTANEOUS | 2 refills | Status: DC

## 2020-03-17 MED ORDER — OTEZLA 30 MG PO TABS: 30.0000 mg | ORAL_TABLET | Freq: Two times a day (BID) | ORAL | 12 refills | Status: DC

## 2020-03-17 MED ORDER — EUCRISA 2 % EX OINT: 1.0000 "application " | TOPICAL_OINTMENT | CUTANEOUS | 3 refills | Status: DC

## 2020-03-17 NOTE — Progress Notes (Signed)
   Follow-Up Visit   Subjective  Tyrone Mccormick is a 59 y.o. male who presents for the following: Dermatitis (bil hands, R great toe, Eucrisa oint and TMC 0.1% cr, pt ran out of [place]).  The following portions of the chart were reviewed this encounter and updated as appropriate:  Tobacco  Allergies  Meds  Problems  Med Hx  Surg Hx  Fam Hx     Review of Systems:  No other skin or systemic complaints except as noted in HPI or Assessment and Plan.  Objective  Well appearing patient in no apparent distress; mood and affect are within normal limits.  A focused examination was performed including bil hands, R foot. Relevant physical exam findings are noted in the Assessment and Plan.  Objective  bil hands, feet: Fissures and peeling fingers,    Assessment & Plan  Hand dermatitis vs Psoriasis of hands - flared - with fissures and symptoms bil hands, feet Vs Psoriasis Both conditions exacerbated by requirements at work to gel in and out when entering and leaving a patients room.  BSA 2%  Cont Eucrisa oint qd/bid prn flares Cont TMC 0.1% cr qd prn more severe flares Cont Dove soap, and cerave cream  Start Duobrii qd/bid aa R great toe until improved, samples x 2 given Start Otezla 30mg  1 po qd, pt will titrate up to 30mg  sample x 1 [id number], exp 08/2021  Discussed if not continuing to do well or if wants more improvement may try Otezla  Side effects of Otezla (apremilast) include diarrhea, nausea, headache, upper respiratory infection, depression, and weight decrease (5-10%). It should only be taken by pregnant women after a discussion regarding risks and benefits with their doctor.  Topical steroids (such as triamcinolone, fluocinolone, fluocinonide, mometasone, clobetasol, halobetasol, betamethasone, hydrocortisone) can cause thinning and lightening of the skin if they are used for too long in the same area. Your physician has selected the right strength medicine for your  problem and area affected on the body. Please use your medication only as directed by your physician to prevent side effects.   Crisaborole (EUCRISA) 2 % OINT - bil hands, feet  triamcinolone cream (KENALOG) 0.1 % - bil hands, feet  Apremilast (OTEZLA) 30 MG TABS - bil hands, feet  Apremilast (OTEZLA) 30 MG TABS - bil hands, feet  Return in about 6 weeks (around 04/28/2020) for Hand derm vs psoriasis.  I, Othelia Pulling, RMA, am acting as scribe for Sarina Ser, MD .   Documentation: I have reviewed the above documentation for accuracy and completeness, and I agree with the above.  Sarina Ser, MD

## 2020-03-18 ENCOUNTER — Ambulatory Visit: Payer: No Typology Code available for payment source | Admitting: Dermatology

## 2020-03-22 ENCOUNTER — Encounter: Payer: Self-pay | Admitting: Dermatology

## 2020-04-02 ENCOUNTER — Other Ambulatory Visit: Payer: Self-pay | Admitting: Family Medicine

## 2020-04-02 DIAGNOSIS — Z125 Encounter for screening for malignant neoplasm of prostate: Secondary | ICD-10-CM | POA: Diagnosis not present

## 2020-04-02 DIAGNOSIS — E7800 Pure hypercholesterolemia, unspecified: Secondary | ICD-10-CM | POA: Diagnosis not present

## 2020-04-02 DIAGNOSIS — Z79899 Other long term (current) drug therapy: Secondary | ICD-10-CM | POA: Diagnosis not present

## 2020-04-02 DIAGNOSIS — Z0000 Encounter for general adult medical examination without abnormal findings: Secondary | ICD-10-CM | POA: Diagnosis not present

## 2020-04-06 ENCOUNTER — Telehealth: Payer: Self-pay | Admitting: Pharmacist

## 2020-04-06 ENCOUNTER — Other Ambulatory Visit: Payer: Self-pay | Admitting: Internal Medicine

## 2020-04-06 ENCOUNTER — Other Ambulatory Visit: Payer: Self-pay

## 2020-04-06 ENCOUNTER — Ambulatory Visit (HOSPITAL_BASED_OUTPATIENT_CLINIC_OR_DEPARTMENT_OTHER): Payer: 59 | Admitting: Pharmacist

## 2020-04-06 DIAGNOSIS — L309 Dermatitis, unspecified: Secondary | ICD-10-CM

## 2020-04-06 MED ORDER — OTEZLA 30 MG PO TABS: 30.0000 mg | ORAL_TABLET | Freq: Two times a day (BID) | ORAL | 4 refills | Status: DC

## 2020-04-06 MED FILL — OTEZLA 30 MG TABS: 30 | 30 days supply | Qty: 60 | Fill #0

## 2020-04-06 NOTE — Telephone Encounter (Signed)
Called patient to schedule an appointment for the [employer] [facility]. I was unable to reach the patient so I left a HIPAA-compliant message requesting that the patient return my call.   

## 2020-04-06 NOTE — Progress Notes (Signed)
°  S: Patient presents for review of their specialty medication therapy.  Patient is currently taking Otezla for atopic dermatitis. Patient is managed by Dr. Nehemiah Massed for this.   Adherence: reports taking samples currently. Adherence reported.   Efficacy: reports that the initial dosing has worked well for him.   Dosing:  Active psoriatic arthritis or plaque psoriasis (moderate to severe): Oral: Initial: 10 mg in the morning. Titrate upward by additional 10 mg per day on days 2 to 5 as follows: Day 2: 10 mg twice daily; Day 3: 10 mg in the morning and 20 mg in the evening; Day 4: 20 mg twice daily; Day 5: 20 mg in the morning and 30 mg in the evening. Maintenance dose: 30 mg twice daily starting on day 6  CrCl <30 mL/minute: Initial: 10 mg in the morning on days 1 to 3; titrate using morning doses only (skip evening doses) to 20 mg on days 4 and 5. Maintenance dose: 30 mg once daily in the morning starting on day 6.    Current adverse effects: Headache: none  GI upset: none  Weight loss: none  Neuropsychiatric effects: none   O:  No results found for: WBC, HGB, HCT, MCV, PLT    Chemistry   No results found for: NA, K, CL, CO2, BUN, CREATININE, GLU No results found for: CALCIUM, ALKPHOS, AST, ALT, BILITOT     A/P: 1. Medication review: patient is currently on [place] for atopic dermatitis and is tolerating well. Reviewed the medication including the following: apremilast inhibits phosphodiesterase 4 (PDE4) specific for cyclic adenosine monophosphate (cAMP) which results in increased intracellular cAMP levels and regulation of numerous inflammatory mediators (eg, decreased expression of nitric oxide synthase, TNF-alpha, and interleukin [IL]-23, as well as increased IL-10. Patient educated on purpose, proper use and potential adverse effects of Otezla. Possible adverse effects include weight loss, GI upset, headache, and mood changes. Renal function should be routinely monitored. Administer  without regard to food. Do not crush, chew, or split tablets. No recommendations for any changes at this time.   Benard Halsted, PharmD, [place] [phone]

## 2020-04-29 ENCOUNTER — Ambulatory Visit (INDEPENDENT_AMBULATORY_CARE_PROVIDER_SITE_OTHER): Payer: 59 | Admitting: Dermatology

## 2020-04-29 ENCOUNTER — Other Ambulatory Visit: Payer: Self-pay

## 2020-04-29 DIAGNOSIS — L409 Psoriasis, unspecified: Secondary | ICD-10-CM | POA: Diagnosis not present

## 2020-04-29 NOTE — Progress Notes (Signed)
   Follow-Up Visit   Subjective  Tyrone Mccormick is a 59 y.o. male who presents for the following: hand derm vs psoriasis (hands/feet, Otezla 15mg  q 3 days, Eucrisa qhs, TMC 0.1% cr prn severe flares, Duobrii samples to R great toe, much improved, no side effects from [place]). 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  Therefore he decrease his dose and take it only 3 days a week.  The following portions of the chart were reviewed this encounter and updated as appropriate:  Tobacco  Allergies  Meds  Problems  Med Hx  Surg Hx  Fam Hx     Review of Systems:  No other skin or systemic complaints except as noted in HPI or Assessment and Plan.  Objective  Well appearing patient in no apparent distress; mood and affect are within normal limits.  A focused examination was performed including hands, feet. Relevant physical exam findings are noted in the Assessment and Plan.  Objective  bil hands, feet: Fissures and scales bil thumbs   Assessment & Plan  Psoriasis -severe with fissures of the hands and digits causing issues with work and activities of daily living -Currently on systemic Otezla.   He is flared due to not taking an optimal dose because of concerns about immunosuppression and Covid. 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  His psoriasis was causing fissures on his hands and digits causing issues with work and activities of daily living.  Although he still has these issues its improved on his current treatment of systemic Otezla. Improved but flared and persistent up especially on the thumbs  Increase to Otezla 15mg -30mg  1 po qd Cont Eucrisa oint qd/bid  Cont TMC 0.1% cr  qd up to 5d/wk prn severe flares Cont Duobrii lotion qd 5d/wk to aa R great toe until clear  Pt states no s/e from [place]  Side effects of Otezla (apremilast) include diarrhea, nausea, headache, upper respiratory infection, depression, and weight decrease (5-10%). It should only be taken by pregnant women after a discussion regarding risks and benefits with their doctor.   Return in about 3 months (around 07/29/2020) for Psoriasis.  I, Othelia Pulling, RMA, am acting as scribe for Sarina Ser, MD .  Documentation: I have reviewed the above documentation for accuracy and completeness, and I agree with the above.  Sarina Ser, MD

## 2020-04-30 ENCOUNTER — Encounter: Payer: Self-pay | Admitting: Dermatology

## 2020-04-30 DIAGNOSIS — Z23 Encounter for immunization: Secondary | ICD-10-CM | POA: Diagnosis not present

## 2020-07-08 MED FILL — OTEZLA 30 MG TABS: 30 | 30 days supply | Qty: 60 | Fill #1

## 2020-08-10 MED FILL — OTEZLA 30 MG TABS: 30 | 30 days supply | Qty: 60 | Fill #2

## 2020-08-12 ENCOUNTER — Ambulatory Visit (INDEPENDENT_AMBULATORY_CARE_PROVIDER_SITE_OTHER): Payer: No Typology Code available for payment source | Admitting: Dermatology

## 2020-08-12 ENCOUNTER — Other Ambulatory Visit: Payer: Self-pay

## 2020-08-12 ENCOUNTER — Other Ambulatory Visit: Payer: Self-pay | Admitting: Dermatology

## 2020-08-12 DIAGNOSIS — L400 Psoriasis vulgaris: Secondary | ICD-10-CM | POA: Diagnosis not present

## 2020-08-12 DIAGNOSIS — L309 Dermatitis, unspecified: Secondary | ICD-10-CM | POA: Diagnosis not present

## 2020-08-12 MED ORDER — OTEZLA 30 MG PO TABS: 30.0000 mg | ORAL_TABLET | Freq: Two times a day (BID) | ORAL | 3 refills | Status: DC

## 2020-08-12 MED ORDER — EUCRISA 2 % EX OINT: 1.0000 "application " | TOPICAL_OINTMENT | CUTANEOUS | 3 refills | Status: DC

## 2020-08-12 MED ORDER — TRIAMCINOLONE ACETONIDE 0.1 % EX CREA: 1.0000 "application " | TOPICAL_CREAM | CUTANEOUS | 2 refills | Status: DC

## 2020-08-12 NOTE — Progress Notes (Signed)
   Follow-Up Visit   Subjective  Tyrone Mccormick is a 60 y.o. male who presents for the following: Psoriasis (Of the hand and fingers - much improved, no longer fissuring. Currently using Otezla 30mg  po QD instead of 15mg  po QD that he uses in the summer months. ). Patient has not experienced side effects while on the [place].   The following portions of the chart were reviewed this encounter and updated as appropriate:   Tobacco  Allergies  Meds  Problems  Med Hx  Surg Hx  Fam Hx     Review of Systems:  No other skin or systemic complaints except as noted in HPI or Assessment and Plan.  Objective  Well appearing patient in no apparent distress; mood and affect are within normal limits.  A focused examination was performed including the B/L hands. Relevant physical exam findings are noted in the Assessment and Plan.  Objective  B/L hands: Patch on the R 4th finger, otherwise clear. Tiny fissure on the L thumb.   Assessment & Plan  Plaque psoriasis B/L hands Much improved on oral systemic Otezla without side effects -   Psoriasis is a chronic non-curable, but treatable genetic/hereditary disease that may have other systemic features affecting other organ systems such as joints (Psoriatic Arthritis). It is associated with an increased risk of inflammatory bowel disease, heart disease, non-alcoholic fatty liver disease, and depression.    Continue Otezla 30mg  po QD. Side effects of Otezla (apremilast) include diarrhea, nausea, headache, upper respiratory infection, depression, and weight decrease (5-10%). It should only be taken by pregnant women after a discussion regarding risks and benefits with their doctor. Goal is control of skin condition, not cure.  The use of requires long term medication management, including periodic office visits.  Continue TMC 0.1% cream QD up to 5d/qk PRN severe flares.   Continue Eucrisa 2% ointment QD-BID PRN flares.   Continue CeraVe cream  throughout the day.   Apremilast (OTEZLA) 30 MG TABS - B/L hands  Hand dermatitis  Reordered Medications triamcinolone (KENALOG) 0.1 % Crisaborole (EUCRISA) 2 % OINT  Other Related Medications Apremilast (OTEZLA) 30 MG TABS Apremilast (OTEZLA) 30 MG TABS  Return in about 6 months (around 02/09/2021) for psoriasis follow up - Otezla.  , CMA, am acting as scribe for Tyrone Baltimore, MD .  Documentation: I have reviewed the above documentation for accuracy and completeness, and I agree with the above.  04/12/2021, MD

## 2020-08-24 ENCOUNTER — Encounter: Payer: Self-pay | Admitting: Dermatology

## 2020-09-15 MED FILL — OTEZLA 30 MG TABS: 30 | 30 days supply | Qty: 60 | Fill #0

## 2020-09-21 ENCOUNTER — Other Ambulatory Visit: Payer: Self-pay | Admitting: Family Medicine

## 2020-10-15 MED FILL — OTEZLA 30 MG TABS: 30 | 30 days supply | Qty: 60 | Fill #3

## 2020-11-02 ENCOUNTER — Other Ambulatory Visit (HOSPITAL_COMMUNITY): Payer: Self-pay

## 2020-11-09 ENCOUNTER — Other Ambulatory Visit (HOSPITAL_COMMUNITY): Payer: Self-pay

## 2020-11-09 MED FILL — Apremilast Tab 30 MG: ORAL | 30 days supply | Qty: 60 | Fill #0 | Status: CN

## 2020-11-10 ENCOUNTER — Other Ambulatory Visit (HOSPITAL_COMMUNITY): Payer: Self-pay

## 2020-11-15 ENCOUNTER — Other Ambulatory Visit (HOSPITAL_COMMUNITY): Payer: Self-pay

## 2020-11-19 ENCOUNTER — Other Ambulatory Visit: Payer: Self-pay

## 2020-11-19 MED FILL — Sildenafil Citrate Tab 20 MG: ORAL | 10 days supply | Qty: 40 | Fill #0 | Status: AC

## 2020-11-19 MED FILL — Triamcinolone Acetonide Cream 0.1%: CUTANEOUS | 10 days supply | Qty: 80 | Fill #0 | Status: AC

## 2020-12-20 ENCOUNTER — Other Ambulatory Visit (HOSPITAL_COMMUNITY): Payer: Self-pay

## 2021-01-13 ENCOUNTER — Other Ambulatory Visit: Payer: Self-pay

## 2021-01-13 ENCOUNTER — Encounter: Payer: Self-pay | Admitting: Urology

## 2021-01-13 ENCOUNTER — Ambulatory Visit (INDEPENDENT_AMBULATORY_CARE_PROVIDER_SITE_OTHER): Payer: No Typology Code available for payment source | Admitting: Urology

## 2021-01-13 VITALS — BP 126/74 | HR 77 | Ht 69.0 in | Wt 192.0 lb

## 2021-01-13 DIAGNOSIS — R8281 Pyuria: Secondary | ICD-10-CM

## 2021-01-13 DIAGNOSIS — R350 Frequency of micturition: Secondary | ICD-10-CM | POA: Diagnosis not present

## 2021-01-13 DIAGNOSIS — N401 Enlarged prostate with lower urinary tract symptoms: Secondary | ICD-10-CM | POA: Diagnosis not present

## 2021-01-13 LAB — BLADDER SCAN AMB NON-IMAGING: Scan Result: 48

## 2021-01-13 LAB — URINALYSIS, COMPLETE
Bilirubin, UA: NEGATIVE
Glucose, UA: NEGATIVE
Ketones, UA: NEGATIVE
Nitrite, UA: NEGATIVE
Protein,UA: NEGATIVE
RBC, UA: NEGATIVE
Specific Gravity, UA: 1.02 (ref 1.005–1.030)
Urobilinogen, Ur: 0.2 mg/dL (ref 0.2–1.0)
pH, UA: 6 (ref 5.0–7.5)

## 2021-01-13 LAB — MICROSCOPIC EXAMINATION: Bacteria, UA: NONE SEEN

## 2021-01-13 NOTE — Progress Notes (Signed)
01/13/2021 8:36 PM   Tyrone Mccormick [birth year] [id number]  Referring provider: Orpah Melter, MD [address] [place],   [address]  Chief Complaint  Patient presents with   Urinary Frequency    HPI: Tyrone Mccormick is a 60 y.o. male who presents for evaluation of lower urinary tract symptoms.  Previously seen by Dr. Pilar Jarvis for an elevated PSA Initially seen 07/29/2015 for a PSA of 4.3 Biopsy performed 08/18/2015 remarkable for a 48 g prostate with benign pathology with chronic inflammation On follow-up 02/2016 PSA was 5.2 and prostate MRI was performed 03/22/2016 which showed a 44 g prostate and no lesions suspicious for prostate cancer PSA has been as high as 5.9 but states it was checked by his PCP last week and was 4.5    The main reason for his visit today has been worsening lower urinary tract symptoms  over the last several months He has some decreased force and caliber of his urinary stream, nocturia x2 and urinary hesitancy IPSS 8/25 Denies dysuria, gross hematuria Denies flank, abdominal or pelvic pain On sildenafil for ED     PMH: Past Medical History:  Diagnosis Date   Bronchitis 03/31/2011   ED (erectile dysfunction) of organic origin 07/29/2015   Elevated PSA    HLD (hyperlipidemia)    Hypercholesterolemia 07/29/2015    Surgical History: Past Surgical History:  Procedure Laterality Date   COLONOSCOPY WITH PROPOFOL     COLONOSCOPY WITH PROPOFOL N/A 07/21/2019   Procedure: COLONOSCOPY WITH PROPOFOL;  Surgeon: Toledo, Benay Pike, MD;  Location: [facility] ENDOSCOPY;  Service: Gastroenterology;  Laterality: N/A;   None      Home Medications:  Allergies as of 01/13/2021   No Known Allergies      Medication List        Accurate as of January 13, 2021  8:36 PM. If you have any questions, ask your nurse or doctor.          aspirin 81 MG tablet Take 81 mg by mouth daily. Reported on 08/18/2015   atorvastatin 20 MG tablet Commonly known as:  LIPITOR TAKE 1 TABLET BY MOUTH ONCE DAILY What changed: Another medication with the same name was removed. Continue taking this medication, and follow the directions you see here. Changed by: Abbie Sons, MD   cetirizine 10 MG tablet Commonly known as: ZYRTEC Take 10 mg by mouth daily. Reported on 08/18/2015   Eucrisa 2 % Oint Generic drug: Crisaborole APPLY TO THE AFFECTED AREA(S) TOPICALLY AS DIRECTED ONCE TO TWICE A DAY ON HANDS AND FEET AS NEEDED FOR FLARES   ibuprofen 200 MG tablet Commonly known as: ADVIL Take 200 mg by mouth every 6 (six) hours as needed. Reported on 08/18/2015   MELATONIN PO Take by mouth daily.   multivitamin tablet Take 1 tablet by mouth daily.   Otezla 30 MG Tabs Generic drug: Apremilast TAKE 1 TABLET BY MOUTH 2 TIMES DAILY. What changed: Another medication with the same name was removed. Continue taking this medication, and follow the directions you see here. Changed by: Abbie Sons, MD   saw palmetto 80 MG capsule Take 80 mg by mouth 2 (two) times daily. Reported on 02/25/2016   sildenafil 20 MG tablet Commonly known as: REVATIO TAKE AS DIRECTED BY DOCTOR FOR ED   sildenafil 25 MG tablet Commonly known as: VIAGRA Take 25 mg by mouth daily as needed for erectile dysfunction.   triamcinolone cream 0.1 % Commonly known as: KENALOG APPLY  TO THE AFFECTED AREA(S) ON HANDS AND FEET DAILY AS DIRECTED UP TO 5 DAYS A WEEK AS NEEDED FOR FLARES. AVOID FACE, GROIN, UNDERARMS        Allergies: No Known Allergies  Family History: Family History  Problem Relation Age of Onset   Prostate cancer Cousin    Prostate cancer Other        grandfather   Kidney disease Neg Hx     Social History:  reports that he has never smoked. He has never used smokeless tobacco. He reports current alcohol use. He reports that he does not use drugs.   Physical Exam: BP 126/74   Pulse 77   Ht 5\' 9"  (1.753 m)   Wt 192 lb (87.1 kg)   BMI 28.35 kg/m    Constitutional:  Alert and oriented, No acute distress. HEENT: [place] AT, moist mucus membranes.  Trachea midline, no masses. Cardiovascular: No clubbing, cyanosis, or edema. Respiratory: Normal respiratory effort, no increased work of breathing. GU: Prostate 40 g, smooth without nodules Lymph: No cervical or inguinal lymphadenopathy. Skin: No rashes, bruises or suspicious lesions. Neurologic: Grossly intact, no focal deficits, moving all 4 extremities. Psychiatric: Normal mood and affect.  Laboratory Data:  Urinalysis    Component Value Date/Time   APPEARANCEUR Clear 01/13/2021 1518   GLUCOSEU Negative 01/13/2021 1518   BILIRUBINUR Negative 01/13/2021 1518   PROTEINUR Negative 01/13/2021 1518   UROBILINOGEN 0.2 11/20/2016 1600   NITRITE Negative 01/13/2021 1518   LEUKOCYTESUR 2+ (A) 01/13/2021 1518    Lab Results  Component Value Date   LABMICR See below: 01/13/2021   WBCUA 11-30 (A) 01/13/2021   RBCUA None seen 07/29/2015   LABEPIT 0-10 01/13/2021   BACTERIA None seen 01/13/2021      Assessment & Plan:    1.  BPH with LUTS Mild lower urinary tract symptoms which he states are not bothersome enough that he desires to manage medically Bladder scan PVR 48 mL Annual follow-up and instructed call earlier for worsening voiding symptoms or desire to start medication  2.  Pyuria Urinalysis today with 11-30 WBC No dysuria but does have urinary urgency Urine culture was ordered Will notify with results and further recommendations after review   Abbie Sons, MD  [facility] [address] [place], [place] [address] [phone]

## 2021-01-14 ENCOUNTER — Other Ambulatory Visit: Payer: Self-pay

## 2021-01-14 MED ORDER — ATORVASTATIN CALCIUM 20 MG PO TABS
1.0000 | ORAL_TABLET | Freq: Every day | ORAL | 1 refills | Status: DC
  Filled 2021-01-14: qty 90, 90d supply, fill #0
  Filled 2021-04-16: qty 90, 90d supply, fill #1

## 2021-01-16 LAB — CULTURE, URINE COMPREHENSIVE

## 2021-01-17 ENCOUNTER — Telehealth: Payer: Self-pay | Admitting: *Deleted

## 2021-01-17 NOTE — Telephone Encounter (Signed)
-----   Message from Abbie Sons, MD sent at 01/17/2021  7:18 AM EDT ----- Urine culture negative for infection.  Recommend repeat urinalysis 1 month

## 2021-01-17 NOTE — Telephone Encounter (Signed)
Notified patient as instructed, patient pleased °

## 2021-01-25 ENCOUNTER — Other Ambulatory Visit (HOSPITAL_COMMUNITY): Payer: Self-pay

## 2021-01-27 ENCOUNTER — Ambulatory Visit (INDEPENDENT_AMBULATORY_CARE_PROVIDER_SITE_OTHER): Payer: No Typology Code available for payment source | Admitting: Dermatology

## 2021-01-27 ENCOUNTER — Other Ambulatory Visit: Payer: Self-pay

## 2021-01-27 DIAGNOSIS — D1722 Benign lipomatous neoplasm of skin and subcutaneous tissue of left arm: Secondary | ICD-10-CM | POA: Diagnosis not present

## 2021-01-27 DIAGNOSIS — Z79899 Other long term (current) drug therapy: Secondary | ICD-10-CM | POA: Diagnosis not present

## 2021-01-27 DIAGNOSIS — D1800 Hemangioma unspecified site: Secondary | ICD-10-CM

## 2021-01-27 DIAGNOSIS — L409 Psoriasis, unspecified: Secondary | ICD-10-CM

## 2021-01-27 DIAGNOSIS — L918 Other hypertrophic disorders of the skin: Secondary | ICD-10-CM

## 2021-01-27 DIAGNOSIS — I781 Nevus, non-neoplastic: Secondary | ICD-10-CM

## 2021-01-27 DIAGNOSIS — L821 Other seborrheic keratosis: Secondary | ICD-10-CM

## 2021-01-27 DIAGNOSIS — D229 Melanocytic nevi, unspecified: Secondary | ICD-10-CM

## 2021-01-27 MED ORDER — OTEZLA 30 MG PO TABS
30.0000 mg | ORAL_TABLET | Freq: Every day | ORAL | 6 refills | Status: DC
  Filled 2021-01-27 – 2021-02-11 (×2): qty 60, 60d supply, fill #0

## 2021-01-27 NOTE — Progress Notes (Signed)
   Follow-Up Visit   Subjective  Tyrone Mccormick is a 60 y.o. male who presents for the following: Psoriasis (Bil hands, otezla 30mg  1 po qd, TMC 0.1% cr prn, Eucrisa oint prn flares, had a flare 2 days ago otherwise controlled, no s/e from [facility]) and check moles on back (Back x 2, no changes that pt is aware of).  The following portions of the chart were reviewed this encounter and updated as appropriate:   Tobacco  Allergies  Meds  Problems  Med Hx  Surg Hx  Fam Hx      Review of Systems:  No other skin or systemic complaints except as noted in HPI or Assessment and Plan.  Objective  Well appearing patient in no apparent distress; mood and affect are within normal limits.  All skin waist up examined.  bil hands Guttate patch R volar thumb, palms appear clear  L arm Rubbery nodule  face Dilated blood vessels face   Assessment & Plan   Melanocytic Nevi - Tan-brown and/or pink-flesh-colored symmetric macules and papules - Benign appearing on exam today - Observation - Call clinic for new or changing moles - Recommend daily use of broad spectrum spf 30+ sunscreen to sun-exposed areas.   Hemangiomas - Red papules - Discussed benign nature - Observe - Call for any changes  Seborrheic Keratoses - Stuck-on, waxy, tan-brown papules and/or plaques  - Benign-appearing - Discussed benign etiology and prognosis. - Observe - Call for any changes  Acrochordons (Skin Tags) - Fleshy, skin-colored pedunculated papules - Benign appearing.  - Observe. - If desired, they can be removed with an in office procedure that is not covered by insurance. - Please call the clinic if you notice any new or changing lesions.   Long term medication management.   Psoriasis bil hands  Psoriasis is a chronic non-curable, but treatable genetic/hereditary disease that may have other systemic features affecting other organ systems such as joints (Psoriatic Arthritis). It is associated  with an increased risk of inflammatory bowel disease, heart disease, non-alcoholic fatty liver disease, and depression.     Improved  Cont Otezla 30mg  1 po qd Cont TMC 0.1% cr qd up to 5d/wk prn flares, avoid f/g/a Cont Eucrisa oint qd/bid prn flares  Discussed Delos Haring, Opzeulra   Side effects of Otezla (apremilast) include diarrhea, nausea, headache, upper respiratory infection, depression, and weight decrease (5-10%). It should only be taken by pregnant women after a discussion regarding risks and benefits with their doctor. Goal is control of skin condition, not cure.  The use of Rutherford Nail requires long term medication management, including periodic office visits.   Apremilast (OTEZLA) 30 MG TABS - bil hands Take 1 tablet (30 mg total) by mouth daily.  Lipoma of left upper extremity L arm  Benign, observe  Telangiectasia face  Benign, discussed BBL  Return in about 6 months (around 07/29/2021) for Psoriasis f/u.  I, Othelia Pulling, RMA, am acting as scribe for Sarina Ser, MD . Documentation: I have reviewed the above documentation for accuracy and completeness, and I agree with the above.  Sarina Ser, MD

## 2021-01-27 NOTE — Patient Instructions (Signed)

## 2021-01-28 ENCOUNTER — Other Ambulatory Visit (HOSPITAL_COMMUNITY): Payer: Self-pay

## 2021-02-10 ENCOUNTER — Encounter: Payer: Self-pay | Admitting: Dermatology

## 2021-02-11 ENCOUNTER — Other Ambulatory Visit (HOSPITAL_COMMUNITY): Payer: Self-pay

## 2021-02-11 MED FILL — Apremilast Tab 30 MG: ORAL | 30 days supply | Qty: 60 | Fill #0 | Status: CN

## 2021-02-17 ENCOUNTER — Other Ambulatory Visit: Payer: Self-pay

## 2021-02-17 ENCOUNTER — Other Ambulatory Visit: Payer: No Typology Code available for payment source

## 2021-02-17 DIAGNOSIS — R8281 Pyuria: Secondary | ICD-10-CM

## 2021-02-17 LAB — URINALYSIS, COMPLETE
Bilirubin, UA: NEGATIVE
Glucose, UA: NEGATIVE
Ketones, UA: NEGATIVE
Nitrite, UA: NEGATIVE
Protein,UA: NEGATIVE
RBC, UA: NEGATIVE
Specific Gravity, UA: 1.02 (ref 1.005–1.030)
Urobilinogen, Ur: 1 mg/dL (ref 0.2–1.0)
pH, UA: 7 (ref 5.0–7.5)

## 2021-02-17 LAB — MICROSCOPIC EXAMINATION

## 2021-02-21 ENCOUNTER — Other Ambulatory Visit (HOSPITAL_COMMUNITY): Payer: Self-pay

## 2021-02-21 MED FILL — Apremilast Tab 30 MG: ORAL | 30 days supply | Qty: 60 | Fill #0 | Status: AC

## 2021-03-30 ENCOUNTER — Other Ambulatory Visit: Payer: Self-pay

## 2021-03-30 MED FILL — Sildenafil Citrate Tab 20 MG: ORAL | 3 days supply | Qty: 10 | Fill #0 | Status: AC

## 2021-04-08 ENCOUNTER — Other Ambulatory Visit (HOSPITAL_COMMUNITY): Payer: Self-pay

## 2021-04-12 ENCOUNTER — Other Ambulatory Visit (HOSPITAL_COMMUNITY): Payer: Self-pay

## 2021-04-18 ENCOUNTER — Other Ambulatory Visit: Payer: Self-pay

## 2021-05-16 ENCOUNTER — Other Ambulatory Visit (HOSPITAL_COMMUNITY): Payer: Self-pay

## 2021-05-16 ENCOUNTER — Telehealth: Payer: Self-pay | Admitting: Pharmacist

## 2021-05-16 NOTE — Telephone Encounter (Signed)
Called patient to schedule an appointment for the [facility]. I was unable to reach the patient so I left a HIPAA-compliant message requesting that the patient return my call.   Luke Van Ausdall, PharmD, BCACP, CPP Clinical Pharmacist [facility] [phone]  

## 2021-05-20 ENCOUNTER — Other Ambulatory Visit (HOSPITAL_COMMUNITY): Payer: Self-pay

## 2021-05-27 ENCOUNTER — Other Ambulatory Visit (HOSPITAL_COMMUNITY): Payer: Self-pay

## 2021-06-09 ENCOUNTER — Ambulatory Visit: Payer: No Typology Code available for payment source | Attending: Family Medicine

## 2021-06-09 ENCOUNTER — Other Ambulatory Visit: Payer: Self-pay

## 2021-06-09 DIAGNOSIS — R269 Unspecified abnormalities of gait and mobility: Secondary | ICD-10-CM | POA: Insufficient documentation

## 2021-06-09 NOTE — Therapy (Signed)
[place] MAIN [facility] SERVICES [address] [place], [place], [address] Phone: [phone]   Fax:  [phone]  Patient Details  Name: Tyrone Mccormick MRN: [id number] Date of Birth: 04-05-1961 Referring Provider:  Orpah Melter, MD  Encounter Date: 06/09/2021  PT Screening Form   Time: in__14:30____     Time out__14:55___     Complaint ___Left knee pain________ Past Medical Hx:  Bronchitis, ED, Elevated PSA, HLD, Hypercholesterolemia Injury Date:__Insidious onset approx 1 and 1/2 month ago____  Pain Scale: __none present currently; 7-8/10_________ Patient's phone number:  [phone]  Hx (this occurrence):   Patient reports left knee pain started about 1 1/2 month ago- No Mechanism of injury. 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   Pain:  Left medial aspect of knee. Describes as "Sore"- Current 0/10; worst=7-8/10; Intermittent pain Worse with prolonged sitting- stiffness   Patient presents with the following knee flex  R= 122 deg and Left = 112 deg * pain limited Palpation: Tenderness only along medial joint line.  Edema- present- Circumference measured 3 cm above tibial plateau- R: 35.2 and L= 37.3 cm  Patient presents wearing his copper fit knee brace and states he could tell it helped him make it through his work day.   Patient presents with antalgic gait - utilizing no device.        Assessment:  Due to length of symptoms and progressively worsening  depsite performing rest, ice, compression, and elevation per patient report; medial joint line pain, Lack of formal testing  - PT recommending further evaluation with need for referral to Orthopedic Specialist. Patient presents with intermittent left knee yet progressively worsening. He presents with some swelling Left medial knee and tenderness along joint line with pain limited walking.      Recommendations:    Comments: Patient instructed to  contact Dr. Orpah Melter to inform him of medical condition and request for possible Ortho consult for further evaluation of left knee pain.     [x]  Patient would benefit from an MD referral []  Patient would benefit from a full PT/OT/ SLP evaluation and treatment. []  No intervention recommended at this time.             Lewis Moccasin, PT 06/09/2021, 3:34 PM  [place] MAIN [facility] SERVICES [address] [place], [place], [address] Phone: [phone]   Fax:  [phone]

## 2021-06-14 ENCOUNTER — Other Ambulatory Visit (HOSPITAL_COMMUNITY): Payer: Self-pay

## 2021-06-14 ENCOUNTER — Other Ambulatory Visit: Payer: Self-pay

## 2021-06-14 ENCOUNTER — Ambulatory Visit: Payer: No Typology Code available for payment source | Attending: Family Medicine | Admitting: Pharmacist

## 2021-06-14 DIAGNOSIS — L409 Psoriasis, unspecified: Secondary | ICD-10-CM

## 2021-06-14 MED ORDER — OTEZLA 30 MG PO TABS
30.0000 mg | ORAL_TABLET | Freq: Every day | ORAL | 6 refills | Status: DC
  Filled 2021-06-14: qty 30, 30d supply, fill #0
  Filled 2021-09-19: qty 30, 30d supply, fill #1
  Filled 2021-11-22: qty 30, 30d supply, fill #2
  Filled 2021-12-09: qty 30, 30d supply, fill #3
  Filled 2022-03-06: qty 30, 30d supply, fill #4
  Filled 2022-04-25: qty 30, 30d supply, fill #5
  Filled 2022-06-01: qty 30, 30d supply, fill #6

## 2021-06-14 NOTE — Progress Notes (Signed)
  S: Patient presents for review of their specialty medication therapy.  Patient is currently taking Otezla for atopic dermatitis. Patient is managed by Dr. Kowalski for this.   Adherence: adherence reported  Efficacy: reports that it controls things well  Dosing:  Active psoriatic arthritis or plaque psoriasis (moderate to severe): Oral: Initial: 10 mg in the morning. Titrate upward by additional 10 mg per day on days 2 to 5 as follows: Day 2: 10 mg twice daily; Day 3: 10 mg in the morning and 20 mg in the evening; Day 4: 20 mg twice daily; Day 5: 20 mg in the morning and 30 mg in the evening. Maintenance dose: 30 mg twice daily starting on day 6  CrCl <30 mL/minute: Initial: 10 mg in the morning on days 1 to 3; titrate using morning doses only (skip evening doses) to 20 mg on days 4 and 5. Maintenance dose: 30 mg once daily in the morning starting on day 6.    Current adverse effects: Headache: none  GI upset: none  Weight loss: none  Neuropsychiatric effects: none   O:  No results found for: "WBC", "HGB", "HCT", "MCV", "PLT"    Chemistry   No results found for: "NA", "K", "CL", "CO2", "BUN", "CREATININE", "GLU" No results found for: "CALCIUM", "ALKPHOS", "AST", "ALT", "BILITOT"     A/P: 1. Medication review: patient is currently on Otezla for atopic dermatitis and is tolerating well. Reviewed the medication including the following: apremilast inhibits phosphodiesterase 4 (PDE4) specific for cyclic adenosine monophosphate (cAMP) which results in increased intracellular cAMP levels and regulation of numerous inflammatory mediators (eg, decreased expression of nitric oxide synthase, TNF-alpha, and interleukin [IL]-23, as well as increased IL-10. Patient educated on purpose, proper use and potential adverse effects of Otezla. Possible adverse effects include weight loss, GI upset, headache, and mood changes. Renal function should be routinely monitored. Administer without regard to  food. Do not crush, chew, or split tablets. No recommendations for any changes at this time.   Luke Van Ausdall, PharmD, BCACP, CPP Clinical Pharmacist [facility] [phone]      

## 2021-06-28 ENCOUNTER — Other Ambulatory Visit (HOSPITAL_COMMUNITY): Payer: Self-pay

## 2021-07-01 ENCOUNTER — Other Ambulatory Visit (HOSPITAL_COMMUNITY): Payer: Self-pay

## 2021-07-07 ENCOUNTER — Other Ambulatory Visit: Payer: Self-pay

## 2021-07-07 ENCOUNTER — Ambulatory Visit (INDEPENDENT_AMBULATORY_CARE_PROVIDER_SITE_OTHER): Payer: No Typology Code available for payment source | Admitting: Sports Medicine

## 2021-07-07 ENCOUNTER — Ambulatory Visit (INDEPENDENT_AMBULATORY_CARE_PROVIDER_SITE_OTHER): Payer: No Typology Code available for payment source

## 2021-07-07 DIAGNOSIS — M1712 Unilateral primary osteoarthritis, left knee: Secondary | ICD-10-CM | POA: Insufficient documentation

## 2021-07-07 DIAGNOSIS — M25562 Pain in left knee: Secondary | ICD-10-CM | POA: Diagnosis not present

## 2021-07-07 DIAGNOSIS — G8929 Other chronic pain: Secondary | ICD-10-CM

## 2021-07-07 DIAGNOSIS — Z09 Encounter for follow-up examination after completed treatment for conditions other than malignant neoplasm: Secondary | ICD-10-CM | POA: Diagnosis not present

## 2021-07-07 MED ORDER — MELOXICAM 15 MG PO TABS
ORAL_TABLET | ORAL | 3 refills | Status: DC
  Filled 2021-07-07: qty 30, 30d supply, fill #0

## 2021-07-07 NOTE — Progress Notes (Signed)
    Procedures performed today:    None.  Independent interpretation of notes and tests performed by another provider:   None.  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 He also gets minimal gelling. No discrete trauma though he has been fairly active and athletic through his life. On exam he has only trace effusion, he does have moderate tenderness at the medial joint line, pain with terminal flexion, ligamentous structures are otherwise stable. I think he likely has a degenerative meniscal tear, we will start conservatively as he is not having any overt locking, buckling, catching, switching from ibuprofen to meloxicam, reaction knee brace, x-rays, home conditioning, return to see me in 6 weeks, injection +/- MRI if no better.  Chronic process with exacerbation and pharmacologic intervention  ___________________________________________ Gwen Her. Dianah Field, M.D., ABFM., CAQSM. Primary Care and [place] Instructor of [employer] of [facility] of Medicine

## 2021-07-07 NOTE — Assessment & Plan Note (Signed)
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 He also gets minimal gelling. No discrete trauma though he has been fairly active and athletic through his life. On exam he has only trace effusion, he does have moderate tenderness at the medial joint line, pain with terminal flexion, ligamentous structures are otherwise stable. I think he likely has a degenerative meniscal tear, we will start conservatively as he is not having any overt locking, buckling, catching, switching from ibuprofen to meloxicam, reaction knee brace, x-rays, home conditioning, return to see me in 6 weeks, injection +/- MRI if no better.

## 2021-07-13 ENCOUNTER — Ambulatory Visit: Payer: No Typology Code available for payment source | Admitting: Dermatology

## 2021-07-13 ENCOUNTER — Other Ambulatory Visit: Payer: Self-pay

## 2021-07-13 ENCOUNTER — Other Ambulatory Visit (HOSPITAL_COMMUNITY): Payer: Self-pay

## 2021-07-13 DIAGNOSIS — L409 Psoriasis, unspecified: Secondary | ICD-10-CM | POA: Diagnosis not present

## 2021-07-13 DIAGNOSIS — I781 Nevus, non-neoplastic: Secondary | ICD-10-CM | POA: Diagnosis not present

## 2021-07-13 DIAGNOSIS — L738 Other specified follicular disorders: Secondary | ICD-10-CM | POA: Diagnosis not present

## 2021-07-13 DIAGNOSIS — Z79899 Other long term (current) drug therapy: Secondary | ICD-10-CM

## 2021-07-13 MED ORDER — VTAMA 1 % EX CREA: 1.0000 "application " | TOPICAL_CREAM | Freq: Every day | CUTANEOUS | 2 refills | Status: DC

## 2021-07-13 NOTE — Progress Notes (Signed)
Follow-Up Visit   Subjective  Tyrone Mccormick is a 60 y.o. male who presents for the following: Skin Problem (Check spots on the face ) and Psoriasis (6 months f/u on psoriasis on the hands treating with Otezla tablets, Eucrisa ointment and triamcinolone cream with a good response ). he also has rashes and spots on his face he would like checked.  The following portions of the chart were reviewed this encounter and updated as appropriate:   Tobacco  Allergies  Meds  Problems  Med Hx  Surg Hx  Fam Hx     Review of Systems:  No other skin or systemic complaints except as noted in HPI or Assessment and Plan.  Objective  Well appearing patient in no apparent distress; mood and affect are within normal limits.  A focused examination was performed including face,hands. Relevant physical exam findings are noted in the Assessment and Plan.  hands Scaly plaque at right base of thumb, hands clear   face cheeks telangectasias  Nose Yellowish papules, dilated pored   Assessment & Plan  Psoriasis hands Psoriasis is a chronic non-curable, but treatable genetic/hereditary disease that may have other systemic features affecting other organ systems such as joints (Psoriatic Arthritis). It is associated with an increased risk of inflammatory bowel disease, heart disease, non-alcoholic fatty liver disease, and depression.    Side effects of Otezla (apremilast) include diarrhea, nausea, headache, upper respiratory infection, depression, and weight decrease (5-10%). It should only be taken by pregnant women after a discussion regarding risks and benefits with their doctor. Goal is control of skin condition, not cure.  The use of Rutherford Nail requires long term medication management, including periodic office visits.  -side effectes   Cont Otezla 30 mg 2 tablets daily  Cont Eucrisa ointment prn Cont triamcinolone cream prn  Start Vtama cream apply to skin once a day   Related  Medications Apremilast (OTEZLA) 30 MG TABS Take 1 tablet (30 mg total) by mouth daily.  Tapinarof (VTAMA) 1 % CREA Apply 1 application topically daily.  Telangiectasia face cheeks Discussed the treatment option of BBL/laser.  Typically we recommend 1-3 treatment sessions about 5-8 weeks apart for best results.  The patient's condition may require "maintenance treatments" in the future.  The fee for BBL / laser treatments is $350 per treatment session for the whole face.  A fee can be quoted for other parts of the body. Insurance typically does not pay for BBL/laser treatments and therefore the fee is an out-of-pocket cost.   Sebaceous hyperplasia Nose Chronic and persistent  Start Will prescribe Skin Medicinals Anti-Aging Tretinoin 0.025%/Niacinamide/Vitamin C/Vitamin E/Turmeric/Resveratrol with Hyaluronic Acid. Apply pea sized amount nightly to the entire face.  The patient was advised this is not covered by insurance since it is made by a compounding pharmacy. They will receive an email to check out and the medication will be mailed to their home.   Topical retinoid medications like tretinoin can cause dryness and irritation when first started. Only apply a pea-sized amount to the entire affected area. Avoid applying it around the eyes, edges of mouth and creases at the nose. If you experience irritation, use a good moisturizer first and/or apply the medicine less often. If you are doing well with the medicine, you can increase how often you use it until you are applying every night. Be careful with sun protection while using this medication as it can make you sensitive to the sun. This medicine should not be used by pregnant  women.    Return in about 6 months (around 01/11/2022) for psoriasis, schedule cosmetic BBL for telangectasia .  IMarye Round, CMA, am acting as scribe for Sarina Ser, MD .  Documentation: I have reviewed the above documentation for accuracy and completeness, and  I agree with the above.  Sarina Ser, MD

## 2021-07-13 NOTE — Patient Instructions (Signed)

## 2021-07-20 ENCOUNTER — Other Ambulatory Visit (HOSPITAL_COMMUNITY): Payer: Self-pay

## 2021-07-22 ENCOUNTER — Other Ambulatory Visit: Payer: Self-pay

## 2021-07-22 ENCOUNTER — Encounter: Payer: Self-pay | Admitting: Dermatology

## 2021-07-22 MED ORDER — ATORVASTATIN CALCIUM 20 MG PO TABS
20.0000 mg | ORAL_TABLET | Freq: Every day | ORAL | 0 refills | Status: DC
  Filled 2021-07-22: qty 90, 90d supply, fill #0

## 2021-07-27 ENCOUNTER — Other Ambulatory Visit: Payer: Self-pay

## 2021-07-27 ENCOUNTER — Ambulatory Visit (INDEPENDENT_AMBULATORY_CARE_PROVIDER_SITE_OTHER): Payer: No Typology Code available for payment source

## 2021-07-27 ENCOUNTER — Ambulatory Visit (INDEPENDENT_AMBULATORY_CARE_PROVIDER_SITE_OTHER): Payer: No Typology Code available for payment source | Admitting: Sports Medicine

## 2021-07-27 DIAGNOSIS — M25562 Pain in left knee: Secondary | ICD-10-CM

## 2021-07-27 DIAGNOSIS — M1712 Unilateral primary osteoarthritis, left knee: Secondary | ICD-10-CM

## 2021-07-27 DIAGNOSIS — G8929 Other chronic pain: Secondary | ICD-10-CM | POA: Diagnosis not present

## 2021-07-27 NOTE — Assessment & Plan Note (Signed)
This is a pleasant 60 year old male, we have been treating him for left knee pain, meloxicam ineffective, had an episode of severe knee swelling, redness, pain that reached a crescendo in a matter of hours. This is suspicious for gout. He has treated at home with ibuprofen, price, and symptoms improved considerably. Today we injected his left knee, if he does have another flare with an effusion and erythema, warmth I would like to perform an arthrocentesis for diagnostic purposes.

## 2021-07-27 NOTE — Progress Notes (Signed)
° ° °  Procedures performed today:    Procedure: Real-time Ultrasound Guided injection of the left knee Device: Samsung HS60  Verbal informed consent obtained.  Time-out conducted.  Noted no overlying erythema, induration, or other signs of local infection.  Skin prepped in a sterile fashion.  Local anesthesia: Topical Ethyl chloride.  With sterile technique and under real time ultrasound guidance: No effusion noted, 1 cc Kenalog 40, 2 cc lidocaine, 2 cc bupivacaine injected easily Completed without difficulty  Advised to call if fevers/chills, erythema, induration, drainage, or persistent bleeding.  Images permanently stored and available for review in PACS.  Impression: Technically successful ultrasound guided injection.  Independent interpretation of notes and tests performed by another provider:   None.  Brief History, Exam, Impression, and Recommendations:    Primary osteoarthritis of left knee This is a pleasant 60 year old male, we have been treating him for left knee pain, meloxicam ineffective, had an episode of severe knee swelling, redness, pain that reached a crescendo in a matter of hours. This is suspicious for gout. He has treated at home with ibuprofen, price, and symptoms improved considerably. Today we injected his left knee, if he does have another flare with an effusion and erythema, warmth I would like to perform an arthrocentesis for diagnostic purposes.    ___________________________________________ Gwen Her. Dianah Field, M.D., ABFM., CAQSM. Primary Care and [place] Instructor of [place] [employer] [facility] of Medicine

## 2021-08-13 ENCOUNTER — Other Ambulatory Visit: Payer: Self-pay

## 2021-08-15 ENCOUNTER — Other Ambulatory Visit: Payer: Self-pay

## 2021-08-15 MED ORDER — SILDENAFIL CITRATE 20 MG PO TABS
ORAL_TABLET | ORAL | 3 refills | Status: DC
  Filled 2021-08-15: qty 10, 3d supply, fill #0
  Filled 2021-09-28: qty 10, 3d supply, fill #1
  Filled 2022-01-17: qty 10, 3d supply, fill #2

## 2021-08-17 ENCOUNTER — Other Ambulatory Visit: Payer: Self-pay

## 2021-08-18 ENCOUNTER — Ambulatory Visit: Payer: Self-pay | Admitting: Sports Medicine

## 2021-09-19 ENCOUNTER — Other Ambulatory Visit (HOSPITAL_COMMUNITY): Payer: Self-pay

## 2021-09-21 ENCOUNTER — Other Ambulatory Visit (HOSPITAL_COMMUNITY): Payer: Self-pay

## 2021-09-23 ENCOUNTER — Other Ambulatory Visit (HOSPITAL_COMMUNITY): Payer: Self-pay

## 2021-09-29 ENCOUNTER — Other Ambulatory Visit: Payer: Self-pay

## 2021-10-06 ENCOUNTER — Other Ambulatory Visit: Payer: Self-pay

## 2021-10-06 MED ORDER — SILDENAFIL CITRATE 20 MG PO TABS
ORAL_TABLET | ORAL | 11 refills | Status: DC
  Filled 2021-10-06: qty 40, 30d supply, fill #0
  Filled 2022-01-17: qty 40, 90d supply, fill #1
  Filled 2022-04-12: qty 40, 90d supply, fill #2
  Filled 2022-07-25: qty 40, 10d supply, fill #3

## 2021-10-09 ENCOUNTER — Other Ambulatory Visit: Payer: Self-pay

## 2021-10-10 ENCOUNTER — Other Ambulatory Visit: Payer: Self-pay | Admitting: Family Medicine

## 2021-10-10 ENCOUNTER — Other Ambulatory Visit: Payer: Self-pay

## 2021-10-10 MED ORDER — ATORVASTATIN CALCIUM 20 MG PO TABS
20.0000 mg | ORAL_TABLET | Freq: Every day | ORAL | 1 refills | Status: DC
  Filled 2021-10-10: qty 90, 90d supply, fill #0
  Filled 2022-01-10: qty 90, 90d supply, fill #1

## 2021-10-17 ENCOUNTER — Other Ambulatory Visit (HOSPITAL_COMMUNITY): Payer: Self-pay

## 2021-10-27 ENCOUNTER — Ambulatory Visit: Payer: Self-pay | Admitting: Dermatology

## 2021-11-21 ENCOUNTER — Telehealth: Payer: Self-pay | Admitting: Urology

## 2021-11-21 NOTE — Telephone Encounter (Signed)
Recent PSA elevated above baseline 6.76.  Please schedule follow-up sometime in May for UA and DRE ? ?Left message to call back to schedule appt. ? ?Sharyn Lull ?

## 2021-11-22 ENCOUNTER — Other Ambulatory Visit (HOSPITAL_COMMUNITY): Payer: Self-pay

## 2021-11-23 ENCOUNTER — Other Ambulatory Visit (HOSPITAL_COMMUNITY): Payer: Self-pay

## 2021-11-29 NOTE — Telephone Encounter (Signed)
Recent PSA elevated above baseline 6.76.  Please schedule follow-up sometime in May for UA and DRE ? ?Left message to call  ?Patient just needs to schedule an appt ? ?Sharyn Lull ?

## 2021-12-09 ENCOUNTER — Other Ambulatory Visit (HOSPITAL_COMMUNITY): Payer: Self-pay

## 2021-12-19 ENCOUNTER — Ambulatory Visit (INDEPENDENT_AMBULATORY_CARE_PROVIDER_SITE_OTHER): Payer: No Typology Code available for payment source | Admitting: Urology

## 2021-12-19 ENCOUNTER — Encounter: Payer: Self-pay | Admitting: Urology

## 2021-12-19 VITALS — BP 102/63 | HR 79 | Ht 70.0 in | Wt 175.0 lb

## 2021-12-19 DIAGNOSIS — R972 Elevated prostate specific antigen [PSA]: Secondary | ICD-10-CM | POA: Diagnosis not present

## 2021-12-19 DIAGNOSIS — N401 Enlarged prostate with lower urinary tract symptoms: Secondary | ICD-10-CM

## 2021-12-19 NOTE — Progress Notes (Signed)
12/19/2021 3:16 PM   Erman Thum Bettcher 1960-08-22 [id number]  Referring provider: Orpah Melter, MD [address] [place],  [place] [address]  Chief Complaint  Patient presents with   Elevated PSA    HPI: 61 y.o. male who presents for follow-up of an elevated PSA.  Previously seen by Dr. Pilar Jarvis for an elevated PSA Initially seen 07/29/2015 for a PSA of 4.3 Biopsy performed 08/18/2015 remarkable for a 48 g prostate with benign pathology with chronic inflammation On follow-up 02/2016 PSA was 5.2 and prostate MRI was performed 03/22/2016 which showed a 44 g prostate and no lesions suspicious for prostate cancer PSA has been as high as 5.9 but states it was checked by his PCP last week and was 4.5 Last PSA March 2023 was elevated but baseline at 6.76 Taking Super Beta Prostate and does feel he has less urgency and a better stream  PMH: Past Medical History:  Diagnosis Date   Bronchitis 03/31/2011   ED (erectile dysfunction) of organic origin 07/29/2015   Elevated PSA    HLD (hyperlipidemia)    Hypercholesterolemia 07/29/2015    Surgical History: Past Surgical History:  Procedure Laterality Date   COLONOSCOPY WITH PROPOFOL     COLONOSCOPY WITH PROPOFOL N/A 07/21/2019   Procedure: COLONOSCOPY WITH PROPOFOL;  Surgeon: Toledo, Benay Pike, MD;  Location: [facility] ENDOSCOPY;  Service: Gastroenterology;  Laterality: N/A;   None      Home Medications:  Allergies as of 12/19/2021   No Known Allergies      Medication List        Accurate as of Dec 19, 2021  3:16 PM. If you have any questions, ask your nurse or doctor.          aspirin 81 MG tablet Take 81 mg by mouth daily. Reported on 08/18/2015   atorvastatin 20 MG tablet Commonly known as: LIPITOR TAKE 1 TABLET BY MOUTH ONCE DAILY   cetirizine 10 MG tablet Commonly known as: ZYRTEC Take 10 mg by mouth daily. Reported on 08/18/2015   MELATONIN PO Take by mouth daily.   meloxicam 15 MG tablet Commonly known as:  MOBIC Take 1 tablet by mouth each morning with a meal for 2 weeks, then daily as needed for pain (One tab PO qAM with a meal for 2 weeks, then daily prn pain.)   multivitamin tablet Take 1 tablet by mouth daily.   Otezla 30 MG Tabs Generic drug: Apremilast Take 1 tablet (30 mg total) by mouth daily.   saw palmetto 80 MG capsule Take 80 mg by mouth 2 (two) times daily. Reported on 02/25/2016   sildenafil 20 MG tablet Commonly known as: REVATIO TAKE AS DIRECTED BY DOCTOR FOR ED   sildenafil 20 MG tablet Commonly known as: REVATIO Take 4 tablets by mouth once a day as needed (4 tablets Orally Once a day as needed 30 day(s))   sildenafil 25 MG tablet Commonly known as: VIAGRA Take 25 mg by mouth daily as needed for erectile dysfunction.   Vtama 1 % Crea Generic drug: Tapinarof Apply 1 application topically daily.        Allergies: No Known Allergies  Family History: Family History  Problem Relation Age of Onset   Prostate cancer Cousin    Prostate cancer Other        grandfather   Kidney disease Neg Hx     Social History:  reports that he has never smoked. He has never used smokeless tobacco. He reports current  alcohol use. He reports that he does not use drugs.   Physical Exam: BP 102/63   Pulse 79   Ht '5\' 10"'$  (1.778 m)   Wt 175 lb (79.4 kg)   BMI 25.11 kg/m   Constitutional:  Alert and oriented, No acute distress. HEENT: [place] AT, moist mucus membranes.  Trachea midline, no masses. Cardiovascular: No clubbing, cyanosis, or edema. Respiratory: Normal respiratory effort, no increased work of breathing. GU: Prostate 50 g, smooth without nodules Psychiatric: Normal mood and affect.   Assessment & Plan:    1.  Elevated PSA Most recent PSA elevated above baseline He states he did have intercourse with his wife within 24 hours of his last PSA and was involved in strenuous activity during the day He would like to repeat his PSA and will schedule a lab visit  approximately 1 month Prostate MRI if still elevated above baseline   Abbie Sons, MD  [facility] [address], [place], [place] [address] [phone]

## 2021-12-20 ENCOUNTER — Other Ambulatory Visit (HOSPITAL_COMMUNITY): Payer: Self-pay

## 2021-12-23 ENCOUNTER — Encounter: Payer: Self-pay | Admitting: Urology

## 2022-01-10 ENCOUNTER — Other Ambulatory Visit: Payer: Self-pay

## 2022-01-10 ENCOUNTER — Other Ambulatory Visit (HOSPITAL_COMMUNITY): Payer: Self-pay

## 2022-01-11 ENCOUNTER — Other Ambulatory Visit (HOSPITAL_COMMUNITY): Payer: Self-pay

## 2022-01-12 ENCOUNTER — Ambulatory Visit: Payer: Self-pay | Admitting: Dermatology

## 2022-01-17 ENCOUNTER — Other Ambulatory Visit: Payer: Self-pay

## 2022-01-20 ENCOUNTER — Other Ambulatory Visit: Payer: No Typology Code available for payment source

## 2022-01-20 DIAGNOSIS — N401 Enlarged prostate with lower urinary tract symptoms: Secondary | ICD-10-CM

## 2022-01-21 LAB — PSA: Prostate Specific Ag, Serum: 6.1 ng/mL — ABNORMAL HIGH (ref 0.0–4.0)

## 2022-01-23 ENCOUNTER — Telehealth: Payer: Self-pay | Admitting: Urology

## 2022-01-23 DIAGNOSIS — R972 Elevated prostate specific antigen [PSA]: Secondary | ICD-10-CM

## 2022-01-23 NOTE — Telephone Encounter (Signed)
Left pt message to call, my chart notification also sent

## 2022-01-23 NOTE — Telephone Encounter (Signed)
Repeat PSA remains elevated in the 6 range at 6.1.  Recommend scheduling follow-up prostate MRI.  Order was entered and will call with results.

## 2022-01-30 ENCOUNTER — Other Ambulatory Visit (HOSPITAL_COMMUNITY): Payer: Self-pay

## 2022-02-06 ENCOUNTER — Ambulatory Visit
Admission: RE | Admit: 2022-02-06 | Discharge: 2022-02-06 | Disposition: A | Discharge status: Home / Self Care | Payer: No Typology Code available for payment source | Source: Ambulatory Visit | Attending: Urology | Admitting: Urology

## 2022-02-06 DIAGNOSIS — R972 Elevated prostate specific antigen [PSA]: Secondary | ICD-10-CM | POA: Insufficient documentation

## 2022-02-06 MED ORDER — GADOBUTROL 1 MMOL/ML IV SOLN
8.0000 mL | Freq: Once | INTRAVENOUS | Status: AC | PRN
  Administered 2022-02-06: 8 mL via INTRAVENOUS

## 2022-02-08 ENCOUNTER — Encounter: Payer: Self-pay | Admitting: Urology

## 2022-02-10 ENCOUNTER — Other Ambulatory Visit (HOSPITAL_COMMUNITY): Payer: Self-pay

## 2022-02-19 ENCOUNTER — Telehealth: Payer: Self-pay | Admitting: Urology

## 2022-02-19 NOTE — Telephone Encounter (Signed)
MyChart msg sent 7/5 re prostate bx vs surveillance. Pls contact pt to see if he has any questions and what he has decided.

## 2022-02-20 NOTE — Telephone Encounter (Signed)
Talked with patient he would like to do the Surveillance.

## 2022-02-21 ENCOUNTER — Ambulatory Visit: Payer: No Typology Code available for payment source | Admitting: Dermatology

## 2022-02-21 ENCOUNTER — Other Ambulatory Visit: Payer: Self-pay

## 2022-02-21 DIAGNOSIS — L409 Psoriasis, unspecified: Secondary | ICD-10-CM | POA: Diagnosis not present

## 2022-02-21 DIAGNOSIS — L738 Other specified follicular disorders: Secondary | ICD-10-CM | POA: Diagnosis not present

## 2022-02-21 DIAGNOSIS — Z79899 Other long term (current) drug therapy: Secondary | ICD-10-CM

## 2022-02-21 DIAGNOSIS — L719 Rosacea, unspecified: Secondary | ICD-10-CM

## 2022-02-21 DIAGNOSIS — L309 Dermatitis, unspecified: Secondary | ICD-10-CM | POA: Diagnosis not present

## 2022-02-21 DIAGNOSIS — D1722 Benign lipomatous neoplasm of skin and subcutaneous tissue of left arm: Secondary | ICD-10-CM

## 2022-02-21 MED ORDER — CRISABOROLE 2 % EX OINT
TOPICAL_OINTMENT | CUTANEOUS | 3 refills | Status: DC
  Filled 2022-02-21: qty 100, 30d supply, fill #0

## 2022-02-21 NOTE — Telephone Encounter (Signed)
Please schedule follow-up office visit with PSA prior December 2023

## 2022-02-21 NOTE — Patient Instructions (Addendum)
Topical retinoid medications like tretinoin/Retin-A, adapalene/Differin, tazarotene/Fabior, and Epiduo/Epiduo Forte can cause dryness and irritation when first started. Only apply a pea-sized amount to the entire affected area. Avoid applying it around the eyes, edges of mouth and creases at the nose. If you experience irritation, use a good moisturizer first and/or apply the medicine less often. If you are doing well with the medicine, you can increase how often you use it until you are applying every night. Be careful with sun protection while using this medication as it can make you sensitive to the sun. This medicine should not be used by pregnant women.    Due to recent changes in healthcare laws, you may see results of your pathology and/or laboratory studies on MyChart before the doctors have had a chance to review them. We understand that in some cases there may be results that are confusing or concerning to you. Please understand that not all results are received at the same time and often the doctors may need to interpret multiple results in order to provide you with the best plan of care or course of treatment. Therefore, we ask that you please give us 2 business days to thoroughly review all your results before contacting the office for clarification. Should we see a critical lab result, you will be contacted sooner.   If You Need Anything After Your Visit  If you have any questions or concerns for your doctor, please call our main line at [phone] and press option 4 to reach your doctor's medical assistant. If no one answers, please leave a voicemail as directed and we will return your call as soon as possible. Messages left after 4 pm will be answered the following business day.   You may also send us a message via MyChart. We typically respond to MyChart messages within 1-2 business days.  For prescription refills, please ask your pharmacy to contact our office. Our fax number is  [phone].  If you have an urgent issue when the clinic is closed that cannot wait until the next business day, you can page your doctor at the number below.    Please note that while we do our best to be available for urgent issues outside of office hours, we are not available 24/7.   If you have an urgent issue and are unable to reach us, you may choose to seek medical care at your doctor's office, retail clinic, urgent care center, or emergency room.  If you have a medical emergency, please immediately call 911 or go to the emergency department.  Pager Numbers  - Dr. Kowalski: [phone]  - Dr. Moye: [phone]  - Dr. Stewart: [phone]  In the event of inclement weather, please call our main line at [phone] for an update on the status of any delays or closures.  Dermatology Medication Tips: Please keep the boxes that topical medications come in in order to help keep track of the instructions about where and how to use these. Pharmacies typically print the medication instructions only on the boxes and not directly on the medication tubes.   If your medication is too expensive, please contact our office at [phone] option 4 or send us a message through MyChart.   We are unable to tell what your co-pay for medications will be in advance as this is different depending on your insurance coverage. However, we may be able to find a substitute medication at lower cost or fill out paperwork to get insurance to cover a   needed medication.   If a prior authorization is required to get your medication covered by your insurance company, please allow us 1-2 business days to complete this process.  Drug prices often vary depending on where the prescription is filled and some pharmacies may offer cheaper prices.  The website www.goodrx.com contains coupons for medications through different pharmacies. The prices here do not account for what the cost may be with help from  insurance (it may be cheaper with your insurance), but the website can give you the price if you did not use any insurance.  - You can print the associated coupon and take it with your prescription to the pharmacy.  - You may also stop by our office during regular business hours and pick up a GoodRx coupon card.  - If you need your prescription sent electronically to a different pharmacy, notify our office through [facility] MyChart or by phone at [phone] option 4.     Si Usted Necesita Algo Despus de Su Visita  Tambin puede enviarnos un mensaje a travs de MyChart. Por lo general respondemos a los mensajes de MyChart en el transcurso de 1 a 2 das hbiles.  Para renovar recetas, por favor pida a su farmacia que se ponga en contacto con nuestra oficina. Nuestro nmero de fax es el [phone].  Si tiene un asunto urgente cuando la clnica est cerrada y que no puede esperar hasta el siguiente da hbil, puede llamar/localizar a su doctor(a) al nmero que aparece a continuacin.   Por favor, tenga en cuenta que aunque hacemos todo lo posible para estar disponibles para asuntos urgentes fuera del horario de oficina, no estamos disponibles las 24 horas del da, los 7 das de la semana.   Si tiene un problema urgente y no puede comunicarse con nosotros, puede optar por buscar atencin mdica  en el consultorio de su doctor(a), en una clnica privada, en un centro de atencin urgente o en una sala de emergencias.  Si tiene una emergencia mdica, por favor llame inmediatamente al 911 o vaya a la sala de emergencias.  Nmeros de bper  - Dr. Kowalski: [phone]  - Dra. Moye: [phone]  - Dra. Stewart: [phone]  En caso de inclemencias del tiempo, por favor llame a nuestra lnea principal al [phone] para una actualizacin sobre el estado de cualquier retraso o cierre.  Consejos para la medicacin en dermatologa: Por favor, guarde las cajas en las que vienen los  medicamentos de uso tpico para ayudarle a seguir las instrucciones sobre dnde y cmo usarlos. Las farmacias generalmente imprimen las instrucciones del medicamento slo en las cajas y no directamente en los tubos del medicamento.   Si su medicamento es muy caro, por favor, pngase en contacto con nuestra oficina llamando al [phone] y presione la opcin 4 o envenos un mensaje a travs de MyChart.   No podemos decirle cul ser su copago por los medicamentos por adelantado ya que esto es diferente dependiendo de la cobertura de su seguro. Sin embargo, es posible que podamos encontrar un medicamento sustituto a menor costo o llenar un formulario para que el seguro cubra el medicamento que se considera necesario.   Si se requiere una autorizacin previa para que su compaa de seguros cubra su medicamento, por favor permtanos de 1 a 2 das hbiles para completar este proceso.  Los precios de los medicamentos varan con frecuencia dependiendo del lugar de dnde se surte la receta y alguna farmacias pueden ofrecer precios ms baratos.    farmacias pueden ofrecer precios ms baratos.  El sitio web www.goodrx.com tiene cupones para medicamentos de Airline pilot. Los precios aqu no tienen en cuenta lo que podra costar con la ayuda del seguro (puede ser ms barato con su seguro), pero el sitio web puede darle el precio si no utiliz Research scientist (physical sciences).  - Puede imprimir el cupn correspondiente y llevarlo con su receta a la farmacia.  - Tambin puede pasar por nuestra oficina durante el horario de atencin regular y Charity fundraiser una tarjeta de cupones de GoodRx.  - Si necesita que su receta se enve electrnicamente a una farmacia diferente, informe a nuestra oficina a travs de MyChart de [facility] o por telfono llamando al [phone] y presione la opcin 4.

## 2022-02-21 NOTE — Progress Notes (Signed)
Follow-Up Visit   Subjective  Tyrone Mccormick is a 61 y.o. male who presents for the following: Follow-up (6 month psoriasis follow up.  Improved today, currently using otezla 30 mg qd, eucrissa ointment daily , vtama qd, and tmc cream as needed for flares. ). The patient has spots, moles and lesions to be evaluated, some may be new or changing and the patient has concerns that these could be cancer.  The following portions of the chart were reviewed this encounter and updated as appropriate:  Tobacco  Allergies  Meds  Problems  Med Hx  Surg Hx  Fam Hx     Review of Systems: No other skin or systemic complaints except as noted in HPI or Assessment and Plan.  Objective  Well appearing patient in no apparent distress; mood and affect are within normal limits.  A focused examination was performed including face, nose, b/l hands, b/l arms . Relevant physical exam findings are noted in the Assessment and Plan.  b/l hands Rough peeling on right middle finger tip, left little finger tip, and left index finger volar aspect of DIP otherwise hands are clear Nose Small yellow papules with a central dell. face Mid face erythema with telangiectasias +/- scattered inflammatory papules.  Assessment & Plan  Psoriasis With atopic dermatitis overlap b/l hands Chronic and persistent condition with duration or expected duration over one year. Condition is symptomatic / bothersome to patient. Not to goal.  But improved Psoriasis is a chronic non-curable, but treatable genetic/hereditary disease that may have other systemic features affecting other organ systems such as joints (Psoriatic Arthritis). It is associated with an increased risk of inflammatory bowel disease, heart disease, non-alcoholic fatty liver disease, and depression.     Side effects of Otezla (apremilast) include diarrhea, nausea, headache, upper respiratory infection, depression, and weight decrease (5-10%). It should only be taken  by pregnant women after a discussion regarding risks and benefits with their doctor. Goal is control of skin condition, not cure.  The use of [place] Nail requires long term medication management, including periodic office visits.  -side effectes   Denies no headaches , no upset stomach or diarrhea, no weight loss, no depression  Continue Otezla 30 mg tab 1 po qd Continue Vtama 1 % cream prn Continue Eucrissa ointment qd prn Continue TMC 0.1 cream - prn for flares   Crisaborole 2 % OINT - b/l hands APPLY TO THE AFFECTED AREA(S) TOPICALLY AS DIRECTED ONCE TO TWICE A DAY ON HANDS AND FEET AS NEEDED FOR FLARES  Related Medications Apremilast (OTEZLA) 30 MG TABS Take 1 tablet (30 mg total) by mouth daily. Tapinarof (VTAMA) 1 % CREA Apply 1 application topically daily.  Sebaceous hyperplasia of face Nose Chronic and persistent  Patient will contact us when he needs refills  Continue Skin Medicinals Anti-Aging Tretinoin 0.025%/Niacinamide/Vitamin C/Vitamin E/Turmeric/Resveratrol with Hyaluronic Acid. Apply pea sized amount nightly to the entire face.  The patient was advised this is not covered by insurance since it is made by a compounding pharmacy. They will receive an email to check out and the medication will be mailed to their home.    Topical retinoid medications like tretinoin can cause dryness and irritation when first started. Only apply a pea-sized amount to the entire affected area. Avoid applying it around the eyes, edges of mouth and creases at the nose. If you experience irritation, use a good moisturizer first and/or apply the medicine less often. If you are doing well with the medicine, you  can increase how often you use it until you are applying every night. Be careful with sun protection while using this medication as it can make you sensitive to the sun. This medicine should not be used by pregnant women.    Rosacea face With telengectasia  Rosacea is a chronic progressive skin  condition usually affecting the face of adults, causing redness and/or acne bumps. It is treatable but not curable. It sometimes affects the eyes (ocular rosacea) as well. It may respond to topical and/or systemic medication and can flare with stress, sun exposure, alcohol, exercise and some foods.  Daily application of broad spectrum spf 30+ sunscreen to face is recommended to reduce flares.  Patient defers treatment at this time.  Lipoma of left upper extremity left forearm Benign. Observe Hx of Hereditary multiple lipomatosis  If becomes bothersome can excision  Return in about 6 months (around 08/24/2022) for psoriasis. IRuthell Rummage, CMA, am acting as scribe for Sarina Ser, MD. Documentation: I have reviewed the above documentation for accuracy and completeness, and I agree with the above.  Sarina Ser, MD

## 2022-02-22 ENCOUNTER — Other Ambulatory Visit: Payer: Self-pay

## 2022-02-28 ENCOUNTER — Other Ambulatory Visit (HOSPITAL_COMMUNITY): Payer: Self-pay

## 2022-03-03 ENCOUNTER — Other Ambulatory Visit (HOSPITAL_COMMUNITY): Payer: Self-pay

## 2022-03-03 ENCOUNTER — Encounter: Payer: Self-pay | Admitting: Dermatology

## 2022-03-06 ENCOUNTER — Other Ambulatory Visit (HOSPITAL_COMMUNITY): Payer: Self-pay

## 2022-03-09 ENCOUNTER — Other Ambulatory Visit (HOSPITAL_COMMUNITY): Payer: Self-pay

## 2022-03-24 ENCOUNTER — Other Ambulatory Visit (HOSPITAL_COMMUNITY): Payer: Self-pay

## 2022-04-12 ENCOUNTER — Other Ambulatory Visit: Payer: Self-pay

## 2022-04-13 ENCOUNTER — Other Ambulatory Visit: Payer: Self-pay

## 2022-04-13 MED ORDER — ATORVASTATIN CALCIUM 20 MG PO TABS
20.0000 mg | ORAL_TABLET | Freq: Every day | ORAL | 1 refills | Status: DC
  Filled 2022-04-13: qty 90, 90d supply, fill #0
  Filled 2022-07-05: qty 90, 90d supply, fill #1

## 2022-04-20 ENCOUNTER — Other Ambulatory Visit (HOSPITAL_COMMUNITY): Payer: Self-pay

## 2022-04-24 ENCOUNTER — Other Ambulatory Visit (HOSPITAL_COMMUNITY): Payer: Self-pay

## 2022-04-25 ENCOUNTER — Other Ambulatory Visit (HOSPITAL_COMMUNITY): Payer: Self-pay

## 2022-05-02 ENCOUNTER — Other Ambulatory Visit (HOSPITAL_COMMUNITY): Payer: Self-pay

## 2022-05-05 ENCOUNTER — Other Ambulatory Visit (HOSPITAL_COMMUNITY): Payer: Self-pay

## 2022-05-30 ENCOUNTER — Other Ambulatory Visit (HOSPITAL_COMMUNITY): Payer: Self-pay

## 2022-06-01 ENCOUNTER — Other Ambulatory Visit (HOSPITAL_COMMUNITY): Payer: Self-pay

## 2022-06-15 ENCOUNTER — Telehealth: Payer: Self-pay | Admitting: Pharmacist

## 2022-06-15 NOTE — Telephone Encounter (Signed)
Called patient to schedule an appointment for the [employer]. I was unable to reach the patient so I left a HIPAA-compliant message requesting that the patient return my call.   Luke Van Ausdall, PharmD, BCACP, CPP Clinical Pharmacist [facility] [phone]  

## 2022-06-21 ENCOUNTER — Ambulatory Visit (INDEPENDENT_AMBULATORY_CARE_PROVIDER_SITE_OTHER): Payer: No Typology Code available for payment source | Admitting: Medical-Surgical

## 2022-06-21 ENCOUNTER — Encounter: Payer: Self-pay | Admitting: Medical-Surgical

## 2022-06-21 VITALS — BP 115/71 | HR 62 | Resp 20 | Ht 70.0 in | Wt 187.3 lb

## 2022-06-21 DIAGNOSIS — N529 Male erectile dysfunction, unspecified: Secondary | ICD-10-CM

## 2022-06-21 DIAGNOSIS — R972 Elevated prostate specific antigen [PSA]: Secondary | ICD-10-CM | POA: Diagnosis not present

## 2022-06-21 DIAGNOSIS — E7800 Pure hypercholesterolemia, unspecified: Secondary | ICD-10-CM

## 2022-06-21 DIAGNOSIS — Z7689 Persons encountering health services in other specified circumstances: Secondary | ICD-10-CM

## 2022-06-21 DIAGNOSIS — J309 Allergic rhinitis, unspecified: Secondary | ICD-10-CM | POA: Diagnosis not present

## 2022-06-21 DIAGNOSIS — L409 Psoriasis, unspecified: Secondary | ICD-10-CM

## 2022-06-21 DIAGNOSIS — Z1211 Encounter for screening for malignant neoplasm of colon: Secondary | ICD-10-CM

## 2022-06-21 NOTE — Progress Notes (Addendum)
New Patient Office Visit  Subjective:  Patient ID: Tyrone Mccormick, male    DOB: January 08, 1961  Age: 61 y.o. MRN: [id number]  CC:  Chief Complaint  Patient presents with   Establish Care   HPI Tyrone Mccormick presents to establish care. 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   ED: has been taking Sildenafil '60mg'$  daily as needed with good results. Tolerating the medication well without side effects. Tried taking '80mg'$  but this was a bit too much and messed with his blood pressure. Happy with the medication and would like to continue it. Does not need a refill.  Elevated PSA: followed by Urology. Family history of BPH and elevated PSAs without cancer. Has a couple of episodes of nocturia nightly but this is manageable.  Trouble sleeping: took Melatonin previously but reports this helped fall asleep but he had trouble getting back to sleep after waking to urinate. Has switched to Relaxium and this has been very helpful.   HLD: taking Atorvastatin '20mg'$  daily, tolerating well without side effects.   Allergic rhinitis: Takes Zyrtec '10mg'$  daily as needed. Uses Flonase.  Psoriasis: managed by Dermatology. Taking Otezla and using topical medications to manage symptoms. Doing well on this regimen.   Past Medical History:  Diagnosis Date   Bronchitis 03/31/2011   ED (erectile dysfunction) of organic origin 07/29/2015   Elevated PSA    HLD (hyperlipidemia)    Hypercholesterolemia 07/29/2015    Past Surgical History:  Procedure Laterality Date   COLONOSCOPY WITH PROPOFOL     COLONOSCOPY WITH PROPOFOL N/A 07/21/2019   Procedure: COLONOSCOPY WITH PROPOFOL;  Surgeon: Toledo, Benay Pike, MD;  Location: [facility] ENDOSCOPY;  Service: Gastroenterology;  Laterality: N/A;   None      Family History  Problem Relation Age of Onset   Diabetes Mother    Diabetes Maternal Grandmother    Prostate cancer Cousin    Prostate cancer Other        grandfather   Kidney disease Neg Hx      Social History   Socioeconomic History   Marital status: Married    Spouse name: Not on file   Number of children: Not on file   Years of education: Not on file   Highest education level: Not on file  Occupational History   Not on file  Tobacco Use   Smoking status: Never   Smokeless tobacco: Never  Vaping Use   Vaping Use: Never used  Substance and Sexual Activity   Alcohol use: Never    Comment: none 24hrs   Drug use: Never   Sexual activity: Not Currently  Other Topics Concern   Not on file  Social History Narrative   Not on file   Social Determinants of Health   Financial Resource Strain: Not on file  Food Insecurity: Not on file  Transportation Needs: Not on file  Physical Activity: Not on file  Stress: Not on file  Social Connections: Not on file  Intimate Partner Violence: Not on file    ROS Review of Systems  Constitutional:  Negative for chills, fatigue, fever and unexpected weight change.  HENT:  Negative for congestion, rhinorrhea, sinus pressure and sore throat.   Eyes:  Negative for visual disturbance.  Respiratory:  Negative for cough, chest tightness, shortness of breath and wheezing.   Cardiovascular:  Negative for chest pain, palpitations and leg swelling.  Gastrointestinal:  Negative for abdominal pain, constipation, diarrhea, nausea and vomiting.  Genitourinary:  Negative for dysuria, frequency and urgency.       Nocturia related to BPH  Skin:  Negative for rash.  Neurological:  Negative for dizziness, light-headedness and headaches.  Psychiatric/Behavioral:  Negative for dysphoric mood, self-injury, sleep disturbance and suicidal ideas. The patient is not nervous/anxious.    Objective:   Today's Vitals: BP 115/71 (BP Location: Right Arm, Cuff Size: Normal)   Pulse 62   Resp 20   Ht '5\' 10"'$  (1.778 m)   Wt 187 lb 4.8 oz (85 kg)   SpO2 98%   BMI 26.87 kg/m   Physical Exam Vitals and nursing note reviewed.  Constitutional:       General: He is not in acute distress.    Appearance: Normal appearance. He is not ill-appearing.  HENT:     Head: Normocephalic and atraumatic.  Cardiovascular:     Rate and Rhythm: Normal rate and regular rhythm.     Pulses: Normal pulses.     Heart sounds: Normal heart sounds. No murmur heard.    No friction rub. No gallop.  Pulmonary:     Effort: Pulmonary effort is normal. No respiratory distress.     Breath sounds: Normal breath sounds.  Skin:    General: Skin is warm and dry.  Neurological:     Mental Status: He is alert and oriented to person, place, and time.  Psychiatric:        Mood and Affect: Mood normal.        Behavior: Behavior normal.        Thought Content: Thought content normal.        Judgment: Judgment normal.     Assessment & Plan:   1. Encounter to establish care Reviewed available information and discussed care concerns with patient.   2. ED (erectile dysfunction) of organic origin Continue Sildenafil '60mg'$  daily prn.   3. Hypercholesterolemia Continue Atorvastain '20mg'$  daily. Requesting records for recent labs.   4. Elevated PSA Managed by Urology.   5. Allergic rhinitis, unspecified seasonality, unspecified trigger Continue prn Zyrtec and Flonase.  6. Psoriasis Managed by dermatology.  7. Colon cancer screening Referring to GI.  - Ambulatory referral to Gastroenterology   Outpatient Encounter Medications as of 06/21/2022  Medication Sig   Apremilast (OTEZLA) 30 MG TABS Take 1 tablet (30 mg total) by mouth daily.   aspirin 81 MG tablet Take 81 mg by mouth daily. Reported on 08/18/2015   atorvastatin (LIPITOR) 20 MG tablet TAKE 1 TABLET BY MOUTH ONCE DAILY   cetirizine (ZYRTEC) 10 MG tablet Take 10 mg by mouth daily. Reported on 08/18/2015   Crisaborole 2 % OINT APPLY TO THE AFFECTED AREA(S) TOPICALLY AS DIRECTED ONCE TO TWICE A DAY ON HANDS AND FEET AS NEEDED FOR FLARES   MELATONIN PO Take by mouth daily.   Multiple Vitamin (MULTIVITAMIN)  tablet Take 1 tablet by mouth daily.   sildenafil (REVATIO) 20 MG tablet TAKE AS DIRECTED BY DOCTOR FOR ED   sildenafil (REVATIO) 20 MG tablet 4 tablets Orally Once a day as needed 30 day(s)   sildenafil (VIAGRA) 25 MG tablet Take 25 mg by mouth daily as needed for erectile dysfunction.   Tapinarof (VTAMA) 1 % CREA Apply 1 application topically daily.   [DISCONTINUED] meloxicam (MOBIC) 15 MG tablet One tab PO qAM with a meal for 2 weeks, then daily prn pain.   [DISCONTINUED] saw palmetto 80 MG capsule Take 80 mg by mouth 2 (two) times daily. Reported on 02/25/2016  No facility-administered encounter medications on file as of 06/21/2022.    Follow-up: Return for annual physical exam when due (Sept 2024) or sooner if needed.   Clearnce Sorrel, DNP, APRN, FNP-BC [facility]

## 2022-06-22 ENCOUNTER — Telehealth: Payer: Self-pay

## 2022-06-22 NOTE — Telephone Encounter (Signed)
Patient is needing the Colonoscopy referral sent to:   [place], MD [address], [place] [address]

## 2022-06-26 NOTE — Telephone Encounter (Signed)
Patient was contacted by CMA at [place]. CMA made referral note stating that patient did not require a referral to see Dr. Alice Reichert and closed referral, where patient was instructed to contact this provider to schedule. Do I disregard and resend the referral or follow provided instruction from [place]? Please advise.

## 2022-06-27 ENCOUNTER — Other Ambulatory Visit (HOSPITAL_COMMUNITY): Payer: Self-pay

## 2022-06-27 ENCOUNTER — Other Ambulatory Visit: Payer: Self-pay | Admitting: Dermatology

## 2022-06-27 ENCOUNTER — Ambulatory Visit: Payer: No Typology Code available for payment source | Attending: Medical-Surgical | Admitting: Pharmacist

## 2022-06-27 DIAGNOSIS — Z79899 Other long term (current) drug therapy: Secondary | ICD-10-CM

## 2022-06-27 DIAGNOSIS — L409 Psoriasis, unspecified: Secondary | ICD-10-CM

## 2022-06-27 MED ORDER — OTEZLA 30 MG PO TABS
30.0000 mg | ORAL_TABLET | Freq: Every day | ORAL | 2 refills | Status: DC
  Filled 2022-06-27: qty 30, 30d supply, fill #0

## 2022-06-27 NOTE — Progress Notes (Signed)
  S: Patient presents for review of their specialty medication therapy.  Patient is currently taking Otezla for atopic dermatitis. Patient is managed by Dr. Nehemiah Massed for this.   Adherence: adherence reported  Efficacy: reports that it controls things well  Dosing:  Active psoriatic arthritis or plaque psoriasis (moderate to severe): Oral: Initial: 10 mg in the morning. Titrate upward by additional 10 mg per day on days 2 to 5 as follows: Day 2: 10 mg twice daily; Day 3: 10 mg in the morning and 20 mg in the evening; Day 4: 20 mg twice daily; Day 5: 20 mg in the morning and 30 mg in the evening. Maintenance dose: 30 mg twice daily starting on day 6  CrCl <30 mL/minute: Initial: 10 mg in the morning on days 1 to 3; titrate using morning doses only (skip evening doses) to 20 mg on days 4 and 5. Maintenance dose: 30 mg once daily in the morning starting on day 6.    Current adverse effects: Headache: none  GI upset: none  Weight loss: none  Neuropsychiatric effects: none   O:  No results found for: "WBC", "HGB", "HCT", "MCV", "PLT"    Chemistry   No results found for: "NA", "K", "CL", "CO2", "BUN", "CREATININE", "GLU" No results found for: "CALCIUM", "ALKPHOS", "AST", "ALT", "BILITOT"     A/P: 1. Medication review: patient is currently on [place] for atopic dermatitis and is tolerating well. Reviewed the medication including the following: apremilast inhibits phosphodiesterase 4 (PDE4) specific for cyclic adenosine monophosphate (cAMP) which results in increased intracellular cAMP levels and regulation of numerous inflammatory mediators (eg, decreased expression of nitric oxide synthase, TNF-alpha, and interleukin [IL]-23, as well as increased IL-10. Patient educated on purpose, proper use and potential adverse effects of Otezla. Possible adverse effects include weight loss, GI upset, headache, and mood changes. Renal function should be routinely monitored. Administer without regard to  food. Do not crush, chew, or split tablets. No recommendations for any changes at this time.   Benard Halsted, PharmD, [place], [place] [phone]

## 2022-07-05 ENCOUNTER — Other Ambulatory Visit: Payer: Self-pay

## 2022-07-07 ENCOUNTER — Encounter: Payer: Self-pay | Admitting: Medical-Surgical

## 2022-07-19 ENCOUNTER — Other Ambulatory Visit: Payer: Self-pay

## 2022-07-21 ENCOUNTER — Other Ambulatory Visit (HOSPITAL_COMMUNITY): Payer: Self-pay

## 2022-07-21 ENCOUNTER — Other Ambulatory Visit: Payer: Self-pay

## 2022-07-25 ENCOUNTER — Other Ambulatory Visit: Payer: Self-pay

## 2022-07-25 ENCOUNTER — Other Ambulatory Visit: Payer: No Typology Code available for payment source

## 2022-07-27 ENCOUNTER — Ambulatory Visit: Payer: No Typology Code available for payment source | Admitting: Urology

## 2022-07-28 ENCOUNTER — Ambulatory Visit: Payer: No Typology Code available for payment source | Admitting: Urology

## 2022-08-24 ENCOUNTER — Ambulatory Visit: Payer: No Typology Code available for payment source | Admitting: Dermatology

## 2022-08-30 ENCOUNTER — Ambulatory Visit (INDEPENDENT_AMBULATORY_CARE_PROVIDER_SITE_OTHER): Payer: 59 | Admitting: Dermatology

## 2022-08-30 ENCOUNTER — Other Ambulatory Visit (HOSPITAL_COMMUNITY): Payer: Self-pay

## 2022-08-30 ENCOUNTER — Other Ambulatory Visit: Payer: Self-pay

## 2022-08-30 VITALS — BP 107/67 | HR 73

## 2022-08-30 DIAGNOSIS — L409 Psoriasis, unspecified: Secondary | ICD-10-CM | POA: Diagnosis not present

## 2022-08-30 DIAGNOSIS — Z79899 Other long term (current) drug therapy: Secondary | ICD-10-CM | POA: Diagnosis not present

## 2022-08-30 MED ORDER — CRISABOROLE 2 % EX OINT
1.0000 | TOPICAL_OINTMENT | Freq: Two times a day (BID) | CUTANEOUS | 3 refills | Status: AC
  Filled 2022-08-30: qty 100, 90d supply, fill #0
  Filled 2022-08-31: qty 100, 30d supply, fill #0

## 2022-08-30 MED ORDER — OTEZLA 30 MG PO TABS
30.0000 mg | ORAL_TABLET | Freq: Every day | ORAL | 6 refills | Status: DC
  Filled 2022-08-30: qty 30, 30d supply, fill #0

## 2022-08-30 NOTE — Patient Instructions (Signed)
Due to recent changes in healthcare laws, you may see results of your pathology and/or laboratory studies on MyChart before the doctors have had a chance to review them. We understand that in some cases there may be results that are confusing or concerning to you. Please understand that not all results are received at the same time and often the doctors may need to interpret multiple results in order to provide you with the best plan of care or course of treatment. Therefore, we ask that you please give us 2 business days to thoroughly review all your results before contacting the office for clarification. Should we see a critical lab result, you will be contacted sooner.   If You Need Anything After Your Visit  If you have any questions or concerns for your doctor, please call our main line at [phone] and press option 4 to reach your doctor's medical assistant. If no one answers, please leave a voicemail as directed and we will return your call as soon as possible. Messages left after 4 pm will be answered the following business day.   You may also send us a message via MyChart. We typically respond to MyChart messages within 1-2 business days.  For prescription refills, please ask your pharmacy to contact our office. Our fax number is [phone].  If you have an urgent issue when the clinic is closed that cannot wait until the next business day, you can page your doctor at the number below.    Please note that while we do our best to be available for urgent issues outside of office hours, we are not available 24/7.   If you have an urgent issue and are unable to reach us, you may choose to seek medical care at your doctor's office, retail clinic, urgent care center, or emergency room.  If you have a medical emergency, please immediately call 911 or go to the emergency department.  Pager Numbers  - Dr. Kowalski: [phone]  - Dr. Moye: [phone]  - Dr. Stewart:  [phone]  In the event of inclement weather, please call our main line at [phone] for an update on the status of any delays or closures.  Dermatology Medication Tips: Please keep the boxes that topical medications come in in order to help keep track of the instructions about where and how to use these. Pharmacies typically print the medication instructions only on the boxes and not directly on the medication tubes.   If your medication is too expensive, please contact our office at [phone] option 4 or send us a message through MyChart.   We are unable to tell what your co-pay for medications will be in advance as this is different depending on your insurance coverage. However, we may be able to find a substitute medication at lower cost or fill out paperwork to get insurance to cover a needed medication.   If a prior authorization is required to get your medication covered by your insurance company, please allow us 1-2 business days to complete this process.  Drug prices often vary depending on where the prescription is filled and some pharmacies may offer cheaper prices.  The website www.goodrx.com contains coupons for medications through different pharmacies. The prices here do not account for what the cost may be with help from insurance (it may be cheaper with your insurance), but the website can give you the price if you did not use any insurance.  - You can print the associated coupon and take it with   your prescription to the pharmacy.  - You may also stop by our office during regular business hours and pick up a GoodRx coupon card.  - If you need your prescription sent electronically to a different pharmacy, notify our office through [facility] MyChart or by phone at [phone] option 4.     Si Usted Necesita Algo Despus de Su Visita  Tambin puede enviarnos un mensaje a travs de MyChart. Por lo general respondemos a los mensajes de MyChart en el transcurso de 1 a 2  das hbiles.  Para renovar recetas, por favor pida a su farmacia que se ponga en contacto con nuestra oficina. Nuestro nmero de fax es el [phone].  Si tiene un asunto urgente cuando la clnica est cerrada y que no puede esperar hasta el siguiente da hbil, puede llamar/localizar a su doctor(a) al nmero que aparece a continuacin.   Por favor, tenga en cuenta que aunque hacemos todo lo posible para estar disponibles para asuntos urgentes fuera del horario de oficina, no estamos disponibles las 24 horas del da, los 7 das de la semana.   Si tiene un problema urgente y no puede comunicarse con nosotros, puede optar por buscar atencin mdica  en el consultorio de su doctor(a), en una clnica privada, en un centro de atencin urgente o en una sala de emergencias.  Si tiene una emergencia mdica, por favor llame inmediatamente al 911 o vaya a la sala de emergencias.  Nmeros de bper  - Dr. Kowalski: [phone]  - Dra. Moye: [phone]  - Dra. Stewart: [phone]  En caso de inclemencias del tiempo, por favor llame a nuestra lnea principal al [phone] para una actualizacin sobre el estado de cualquier retraso o cierre.  Consejos para la medicacin en dermatologa: Por favor, guarde las cajas en las que vienen los medicamentos de uso tpico para ayudarle a seguir las instrucciones sobre dnde y cmo usarlos. Las farmacias generalmente imprimen las instrucciones del medicamento slo en las cajas y no directamente en los tubos del medicamento.   Si su medicamento es muy caro, por favor, pngase en contacto con nuestra oficina llamando al [phone] y presione la opcin 4 o envenos un mensaje a travs de MyChart.   No podemos decirle cul ser su copago por los medicamentos por adelantado ya que esto es diferente dependiendo de la cobertura de su seguro. Sin embargo, es posible que podamos encontrar un medicamento sustituto a menor costo o llenar un formulario para que el  seguro cubra el medicamento que se considera necesario.   Si se requiere una autorizacin previa para que su compaa de seguros cubra su medicamento, por favor permtanos de 1 a 2 das hbiles para completar este proceso.  Los precios de los medicamentos varan con frecuencia dependiendo del lugar de dnde se surte la receta y alguna farmacias pueden ofrecer precios ms baratos.  El sitio web www.goodrx.com tiene cupones para medicamentos de diferentes farmacias. Los precios aqu no tienen en cuenta lo que podra costar con la ayuda del seguro (puede ser ms barato con su seguro), pero el sitio web puede darle el precio si no utiliz ningn seguro.  - Puede imprimir el cupn correspondiente y llevarlo con su receta a la farmacia.  - Tambin puede pasar por nuestra oficina durante el horario de atencin regular y recoger una tarjeta de cupones de GoodRx.  - Si necesita que su receta se enve electrnicamente a una farmacia diferente, informe a nuestra oficina a travs de MyChart de [place]   o por telfono llamando al [phone] y presione la opcin 4.  

## 2022-08-30 NOTE — Progress Notes (Signed)
   Follow-Up Visit   Subjective  Tyrone Mccormick is a 62 y.o. male who presents for the following: Follow-up (6 months f/u on psoriasis on his hands, patient taking Otezla 30 mg 4 days a week no side effects from [place] and Triamcinolone cream prn flares only.).  The following portions of the chart were reviewed this encounter and updated as appropriate:   Tobacco  Allergies  Meds  Problems  Med Hx  Surg Hx  Fam Hx     Review of Systems:  No other skin or systemic complaints except as noted in HPI or Assessment and Plan.  Objective  Well appearing patient in no apparent distress; mood and affect are within normal limits.  A focused examination was performed including face,hands. Relevant physical exam findings are noted in the Assessment and Plan.  hands Rough dry patch at the left index finger from holding ultrasound machine at work daily,    Assessment & Plan  Psoriasis hands Psoriasis improved on current regimen  Counseling on psoriasis and coordination of care  psoriasis is a chronic non-curable, but treatable genetic/hereditary disease that may have other systemic features affecting other organ systems such as joints (Psoriatic Arthritis). It is associated with an increased risk of inflammatory bowel disease, heart disease, non-alcoholic fatty liver disease, and depression.  Treatments include light and laser treatments; topical medications; and systemic medications including oral and injectables.   May consider adding topical Vtama in the future  Continue Otezla 30 mg 4 days a week  Continue Eucrisa ointment daily  Continue Triamcinolone cream prn flares only  Long term medication management.  Patient is using long term (months to years) prescription medication  to control their dermatologic condition.  These medications require periodic monitoring to evaluate for efficacy and side effects and may require periodic laboratory monitoring.  Side effects of Otezla  (apremilast) include diarrhea, nausea, headache, upper respiratory infection, depression, and weight decrease (5-10%). It should only be taken by pregnant women after a discussion regarding risks and benefits with their doctor. Goal is control of skin condition, not cure.  The use of [place] Nail requires long term medication management, including periodic office visits.   Topical steroids (such as triamcinolone, fluocinolone, fluocinonide, mometasone, clobetasol, halobetasol, betamethasone, hydrocortisone) can cause thinning and lightening of the skin if they are used for too long in the same area. Your physician has selected the right strength medicine for your problem and area affected on the body. Please use your medication only as directed by your physician to prevent side effects.    Related Medications Crisaborole 2 % OINT Apply 1 Application topically 1 (one) to 2 (two) times daily on hands and feet as needed for flares Apremilast (OTEZLA) 30 MG TABS Take 1 tablet (30 mg total) by mouth daily.  Return in about 6 months (around 02/28/2023) for Psoriasis .  IMarye Round, CMA, am acting as scribe for Sarina Ser, MD .  Documentation: I have reviewed the above documentation for accuracy and completeness, and I agree with the above.  Sarina Ser, MD

## 2022-08-31 ENCOUNTER — Other Ambulatory Visit: Payer: Self-pay | Admitting: Pharmacist

## 2022-08-31 ENCOUNTER — Other Ambulatory Visit: Payer: Self-pay

## 2022-08-31 ENCOUNTER — Other Ambulatory Visit (HOSPITAL_COMMUNITY): Payer: Self-pay

## 2022-08-31 DIAGNOSIS — L409 Psoriasis, unspecified: Secondary | ICD-10-CM

## 2022-08-31 MED ORDER — OTEZLA 30 MG PO TABS
30.0000 mg | ORAL_TABLET | Freq: Every day | ORAL | 6 refills | Status: DC
  Filled 2022-08-31 – 2022-09-08 (×2): qty 30, 30d supply, fill #0
  Filled 2022-11-22: qty 30, 30d supply, fill #1
  Filled 2023-01-25: qty 30, 30d supply, fill #2

## 2022-09-06 ENCOUNTER — Other Ambulatory Visit (HOSPITAL_COMMUNITY): Payer: Self-pay

## 2022-09-07 ENCOUNTER — Encounter: Payer: Self-pay | Admitting: Dermatology

## 2022-09-08 ENCOUNTER — Other Ambulatory Visit (HOSPITAL_COMMUNITY): Payer: Self-pay

## 2022-09-14 ENCOUNTER — Other Ambulatory Visit: Payer: Self-pay

## 2022-09-15 ENCOUNTER — Other Ambulatory Visit: Payer: Self-pay

## 2022-09-26 IMAGING — DX DG KNEE COMPLETE 4+V*L*
4 series · 4 of 4 positions shown · non-contrast
Comparison: None.

CLINICAL DATA: Left knee pain without trauma

EXAM:
LEFT KNEE - COMPLETE 4+ VIEW; RIGHT KNEE - 1-2 VIEW

[knee tunnel]
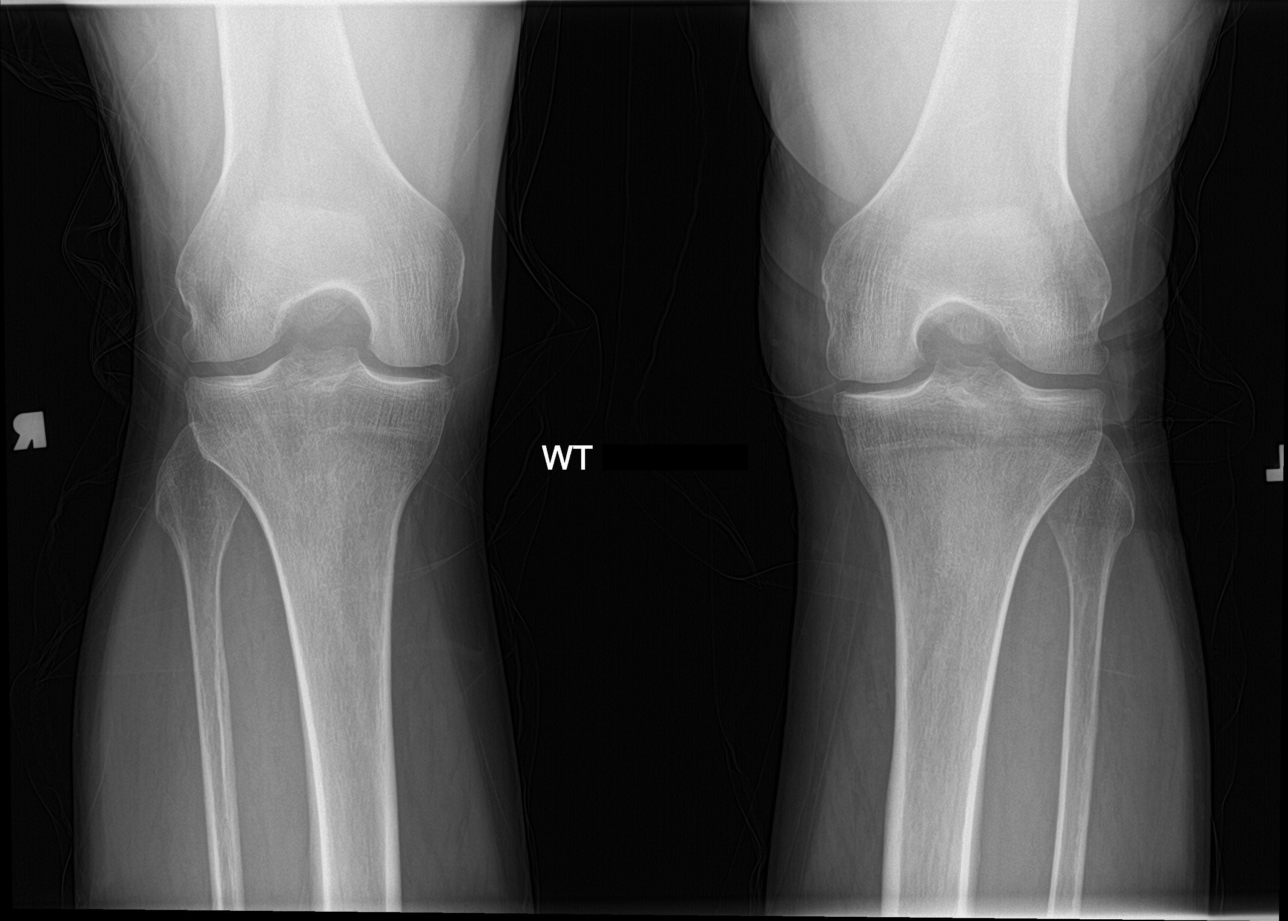

[knee lat]
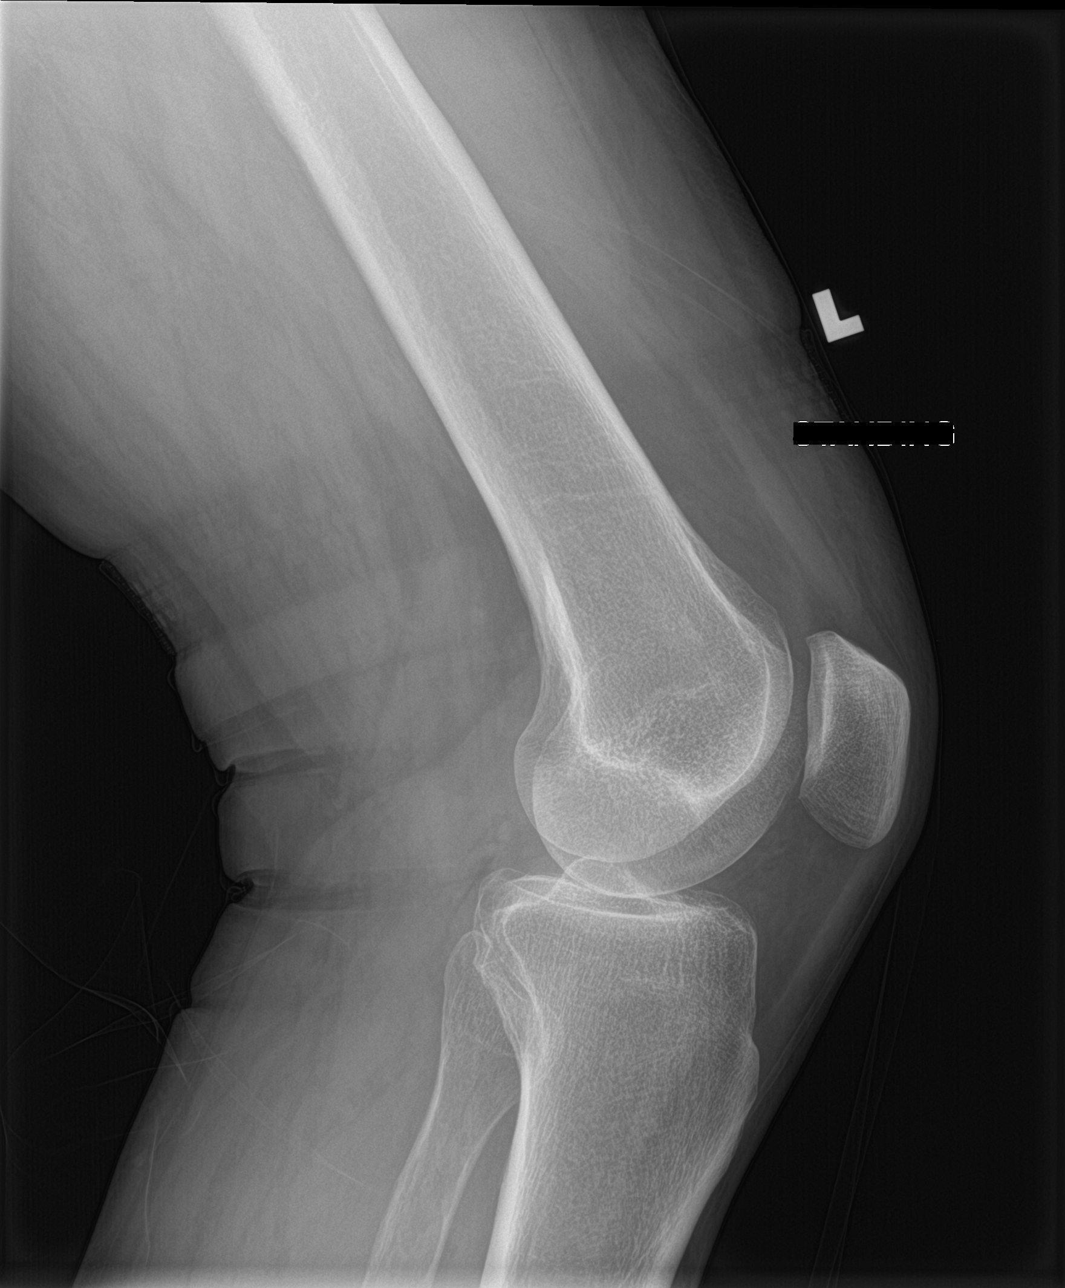

[knee ap bilat standing]
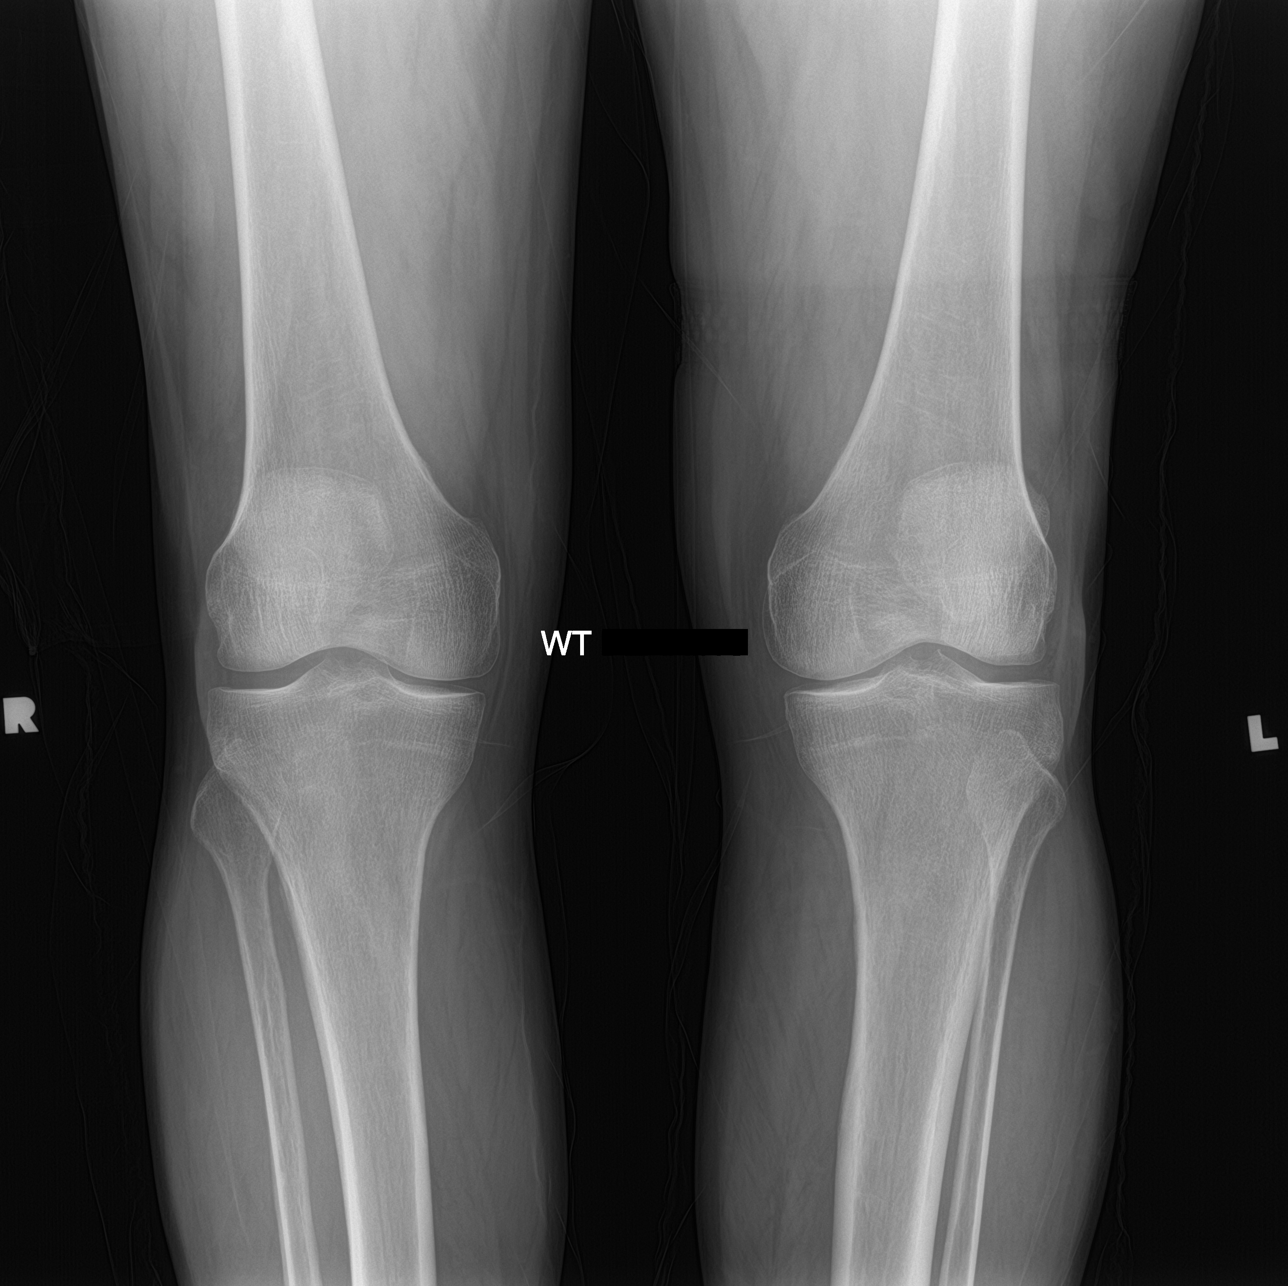

[knee sunrise standing]
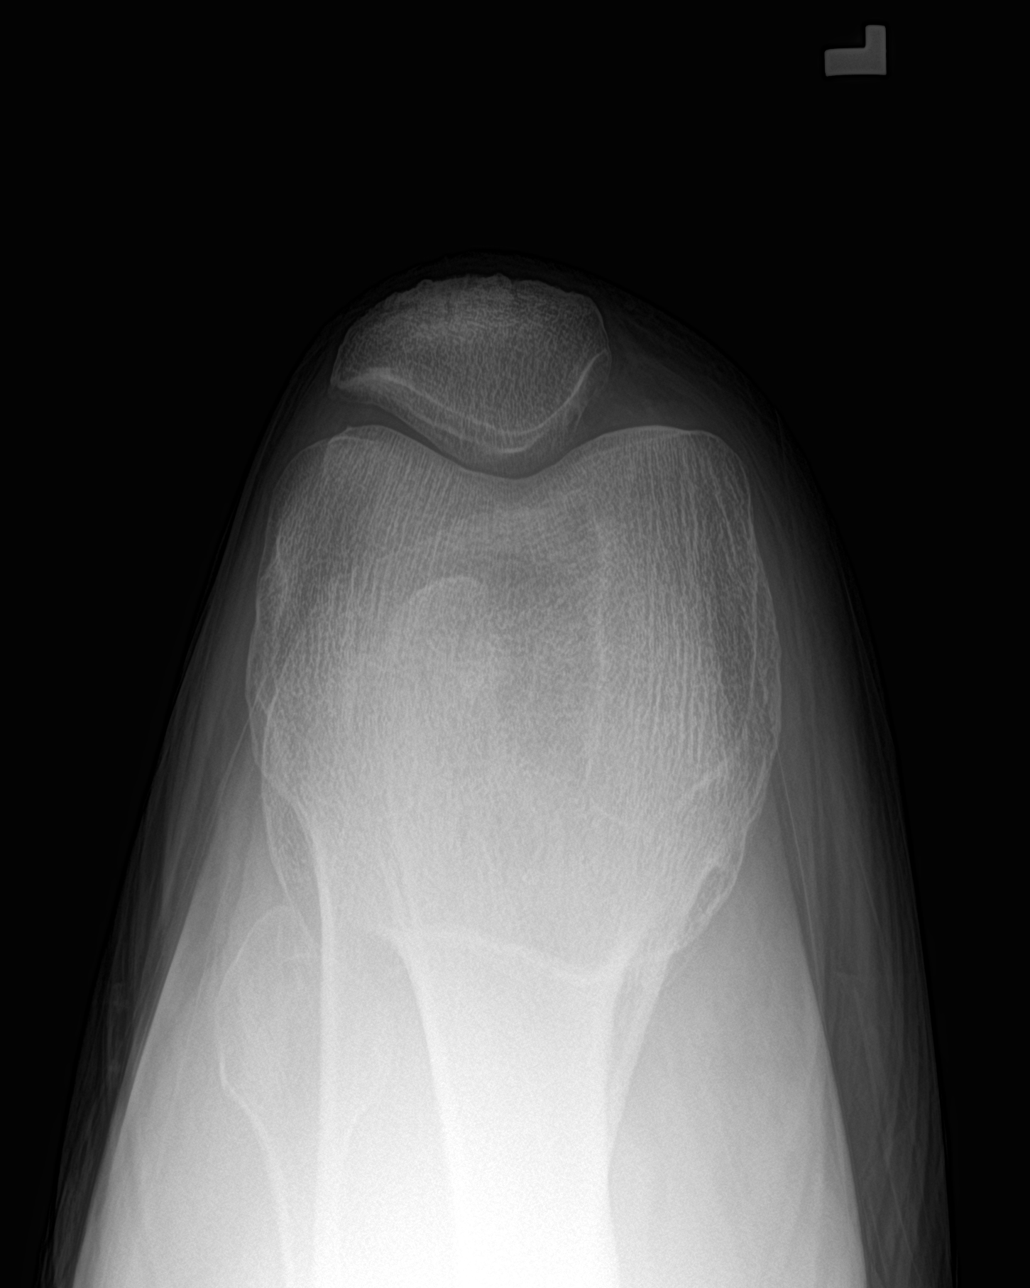

[4 of 4 positions shown; findings below may reference images not displayed]

FINDINGS: There is mild narrowing of the cartilage in the medial compartment
of both knees with early marginal spurs most conspicuous on tunnel
views. No fracture or dislocation. No effusion. Normal
mineralization and alignment.
IMPRESSION: Mild medial compartment DJD bilaterally.

## 2022-10-03 ENCOUNTER — Other Ambulatory Visit: Payer: Self-pay

## 2022-10-04 ENCOUNTER — Other Ambulatory Visit: Payer: Self-pay

## 2022-10-04 ENCOUNTER — Other Ambulatory Visit: Payer: Self-pay | Admitting: Medical-Surgical

## 2022-10-04 MED ORDER — ATORVASTATIN CALCIUM 20 MG PO TABS
20.0000 mg | ORAL_TABLET | Freq: Every day | ORAL | 1 refills | Status: DC
  Filled 2022-10-04: qty 90, 90d supply, fill #0
  Filled 2022-12-28: qty 90, 90d supply, fill #1

## 2022-10-11 DIAGNOSIS — Z86010 Personal history of colonic polyps: Secondary | ICD-10-CM | POA: Diagnosis not present

## 2022-10-11 DIAGNOSIS — K5730 Diverticulosis of large intestine without perforation or abscess without bleeding: Secondary | ICD-10-CM | POA: Diagnosis not present

## 2022-10-30 ENCOUNTER — Other Ambulatory Visit: Payer: Self-pay | Admitting: Medical-Surgical

## 2022-10-30 ENCOUNTER — Other Ambulatory Visit: Payer: Self-pay

## 2022-10-31 ENCOUNTER — Other Ambulatory Visit: Payer: Self-pay

## 2022-10-31 ENCOUNTER — Other Ambulatory Visit: Payer: Self-pay | Admitting: Medical-Surgical

## 2022-10-31 NOTE — Telephone Encounter (Signed)
Patient requesting med refill for sildenafil. Written by external providers.

## 2022-11-01 ENCOUNTER — Other Ambulatory Visit: Payer: Self-pay

## 2022-11-01 MED FILL — Sildenafil Citrate Tab 20 MG: ORAL | 8 days supply | Qty: 40 | Fill #0 | Status: AC

## 2022-11-02 ENCOUNTER — Other Ambulatory Visit: Payer: Self-pay

## 2022-11-22 ENCOUNTER — Other Ambulatory Visit (HOSPITAL_COMMUNITY): Payer: Self-pay

## 2022-12-04 ENCOUNTER — Other Ambulatory Visit: Payer: Self-pay

## 2022-12-04 ENCOUNTER — Other Ambulatory Visit (HOSPITAL_COMMUNITY): Payer: Self-pay

## 2022-12-28 ENCOUNTER — Other Ambulatory Visit: Payer: Self-pay

## 2023-01-24 ENCOUNTER — Other Ambulatory Visit: Payer: Self-pay

## 2023-01-25 ENCOUNTER — Other Ambulatory Visit (HOSPITAL_COMMUNITY): Payer: Self-pay

## 2023-01-26 ENCOUNTER — Other Ambulatory Visit (HOSPITAL_COMMUNITY): Payer: Self-pay

## 2023-02-14 ENCOUNTER — Encounter
Admission: RE | Disposition: A | Discharge status: Home / Self Care | Payer: Self-pay | Source: Home / Self Care | Attending: Internal Medicine

## 2023-02-14 ENCOUNTER — Ambulatory Visit: Payer: 59 | Admitting: Anesthesiology

## 2023-02-14 ENCOUNTER — Ambulatory Visit
Admission: RE | Admit: 2023-02-14 | Discharge: 2023-02-14 | Disposition: A | Discharge status: Home / Self Care | Payer: 59 | Attending: Internal Medicine | Admitting: Internal Medicine

## 2023-02-14 DIAGNOSIS — K5730 Diverticulosis of large intestine without perforation or abscess without bleeding: Secondary | ICD-10-CM | POA: Diagnosis not present

## 2023-02-14 DIAGNOSIS — Z1211 Encounter for screening for malignant neoplasm of colon: Secondary | ICD-10-CM | POA: Diagnosis not present

## 2023-02-14 DIAGNOSIS — Z86010 Personal history of colonic polyps: Secondary | ICD-10-CM | POA: Diagnosis not present

## 2023-02-14 DIAGNOSIS — K648 Other hemorrhoids: Secondary | ICD-10-CM | POA: Diagnosis not present

## 2023-02-14 DIAGNOSIS — K640 First degree hemorrhoids: Secondary | ICD-10-CM | POA: Diagnosis not present

## 2023-02-14 DIAGNOSIS — E7800 Pure hypercholesterolemia, unspecified: Secondary | ICD-10-CM | POA: Diagnosis not present

## 2023-02-14 DIAGNOSIS — E785 Hyperlipidemia, unspecified: Secondary | ICD-10-CM | POA: Insufficient documentation

## 2023-02-14 DIAGNOSIS — N529 Male erectile dysfunction, unspecified: Secondary | ICD-10-CM | POA: Diagnosis not present

## 2023-02-14 DIAGNOSIS — Z09 Encounter for follow-up examination after completed treatment for conditions other than malignant neoplasm: Secondary | ICD-10-CM | POA: Diagnosis not present

## 2023-02-14 HISTORY — PX: COLONOSCOPY: SHX5424

## 2023-02-14 SURGERY — COLONOSCOPY
Anesthesia: General

## 2023-02-14 MED ORDER — LIDOCAINE HCL (PF) 2 % IJ SOLN
INTRAMUSCULAR | Status: DC | PRN
  Administered 2023-02-14: 40 mg via INTRADERMAL

## 2023-02-14 MED ORDER — PROPOFOL 10 MG/ML IV BOLUS
INTRAVENOUS | Status: DC | PRN
  Administered 2023-02-14 (×7): 20 mg via INTRAVENOUS
  Administered 2023-02-14: 50 mg via INTRAVENOUS
  Administered 2023-02-14: 20 mg via INTRAVENOUS

## 2023-02-14 MED ORDER — SODIUM CHLORIDE 0.9 % IV SOLN
INTRAVENOUS | Status: DC
  Administered 2023-02-14: 20 mL/h via INTRAVENOUS

## 2023-02-14 NOTE — Transfer of Care (Signed)
Immediate Anesthesia Transfer of Care Note  Patient: Tyrone Mccormick  Procedure(s) Performed: COLONOSCOPY  Patient Location: PACU and Endoscopy Unit  Anesthesia Type:MAC  Level of Consciousness: drowsy  Airway & Oxygen Therapy: Patient Spontanous Breathing and Patient connected to nasal cannula oxygen  Post-op Assessment: Report given to RN and Post -op Vital signs reviewed and stable  Post vital signs: Reviewed and stable  Last Vitals:  Vitals Value Taken Time  BP 106/70 02/14/23 1132  Temp    Pulse 73 02/14/23 1132  Resp 14 02/14/23 1132  SpO2 96 % 02/14/23 1132  Vitals shown include unvalidated device data.  Last Pain:  Vitals:   02/14/23 1016  TempSrc: Temporal  PainSc: 0-No pain         Complications: No notable events documented.

## 2023-02-14 NOTE — H&P (Signed)
Outpatient short stay form Pre-procedure 02/14/2023 11:01 AM Tyrone Mccormick K. Norma Fredrickson, M.D.  Primary Physician: Christen Butter, NP  Reason for visit:  Personal history of adenomatous colon polyps (2020).  History of present illness:  Tyrone Mccormick presents to the [facility] for chief complaint of high-risk colon cancer surveillance 2/2 personal history of adenomatous colon polyps. Last colonoscopy 07/2019 performed by Dr. Norma Fredrickson showed sigmoid diverticulosis and two subcentimeter polyps with path showing TA and SSA. He denies any known family history of colorectal cancer, advanced adenomas, or IBD. He denies any acute GI complaints today. He denies any recent changes in his bowel habits. He reports he has regular, formed bowel movement daily to every other day. He has worked on Field seismologist intake and this has helped to keep bowels more regular. He denies any issues with hematochezia, melena, fecal urgency, fecal incontinence, abdominal pain, or abdominal cramping. Appetite and diet are stable without any unintentional weight loss. He denies any concerning UGI symptoms such as nausea, vomiting, esophageal dysphagia, odynophagia, early satiety, hoarseness, or epigastric abdominal pain. He works as echo Education officer, environmental at [employer]. No other acute concerns or questions.   Summary of GI Procedures:  CSY 07/21/2019 - sigmoid diverticulosis, one 7 mm TA removed from ascending colon, one 4 mm SSA removed from ascending colon, Grade I non-bleeding IH  CSY 02/27/2014 - sigmoid diverticulosis, three subcentimeter tubular adenomas removed from ascending colon   Current Facility-Administered Medications:    0.9 %  sodium chloride infusion, , Intravenous, Continuous, Zarian Colpitts, Boykin Nearing, MD, Last Rate: 20 mL/hr at 02/14/23 1054, Continued from Pre-op at 02/14/23 1054  Medications Prior to Admission  Medication Sig Dispense Refill Last Dose   Apremilast (OTEZLA) 30 MG TABS Take 1 tablet (30 mg total) by mouth daily. 30  tablet 6 Past Week   aspirin 81 MG tablet Take 81 mg by mouth daily. Reported on 08/18/2015   Past Week   atorvastatin (LIPITOR) 20 MG tablet Take 1 tablet (20 mg total) by mouth daily. 90 tablet 1 Past Week   cetirizine (ZYRTEC) 10 MG tablet Take 10 mg by mouth daily. Reported on 08/18/2015   Past Week   Crisaborole 2 % OINT Apply 1 Application topically 1 (one) to 2 (two) times daily on hands and feet as needed for flares 100 g 3 Past Week   MELATONIN PO Take by mouth daily.   Past Week   Multiple Vitamin (MULTIVITAMIN) tablet Take 1 tablet by mouth daily.   02/13/2023   sildenafil (REVATIO) 20 MG tablet TAKE AS DIRECTED BY DOCTOR FOR ED 40 tablet 3 Past Week   sildenafil (REVATIO) 20 MG tablet Take 1-5 tablets (20-100 mg total) by mouth daily as needed. 40 tablet 1 Past Month   sildenafil (VIAGRA) 25 MG tablet Take 25 mg by mouth daily as needed for erectile dysfunction.   Past Month   sildenafil (REVATIO) 20 MG tablet 4 tablets Orally Once a day as needed 30 day(s) (Patient not taking: Reported on 02/14/2023) 40 tablet 11 Not Taking     No Known Allergies   Past Medical History:  Diagnosis Date   Bronchitis 03/31/2011   ED (erectile dysfunction) of organic origin 07/29/2015   Elevated PSA    HLD (hyperlipidemia)    Hypercholesterolemia 07/29/2015    Review of systems:  Otherwise negative.    Physical Exam  Gen: Alert, oriented. Appears stated age.  HEENT: [place]/AT. PERRLA. Lungs: CTA, no wheezes. CV: RR nl S1, S2. Abd: soft, benign, no masses.  BS+ Ext: No edema. Pulses 2+    Planned procedures: Proceed with colonoscopy. The patient understands the nature of the planned procedure, indications, risks, alternatives and potential complications including but not limited to bleeding, infection, perforation, damage to internal organs and possible oversedation/side effects from anesthesia. The patient agrees and gives consent to proceed.  Please refer to procedure notes for findings,  recommendations and patient disposition/instructions.     Lear Carstens K. Norma Fredrickson, M.D. Gastroenterology 02/14/2023  11:01 AM

## 2023-02-14 NOTE — Anesthesia Postprocedure Evaluation (Signed)
Anesthesia Post Note  Patient: Tyrone Mccormick  Procedure(s) Performed: COLONOSCOPY  Patient location during evaluation: Endoscopy Anesthesia Type: General Level of consciousness: awake and alert Pain management: pain level controlled Vital Signs Assessment: post-procedure vital signs reviewed and stable Respiratory status: spontaneous breathing, nonlabored ventilation, respiratory function stable and patient connected to nasal cannula oxygen Cardiovascular status: blood pressure returned to baseline and stable Postop Assessment: no apparent nausea or vomiting Anesthetic complications: no   No notable events documented.   Last Vitals:  Vitals:   02/14/23 1016 02/14/23 1133  BP: 126/80   Pulse: 77   Resp: 20   Temp: (!) 36 C 36.8 C  SpO2: 98%     Last Pain:  Vitals:   02/14/23 1153  TempSrc:   PainSc: 0-No pain                 [name]

## 2023-02-14 NOTE — Interval H&P Note (Signed)
History and Physical Interval Note:  02/14/2023 11:02 AM  Tyrone Mccormick  has presented today for surgery, with the diagnosis of PRS HX COLON POLYPS.  The various methods of treatment have been discussed with the patient and family. After consideration of risks, benefits and other options for treatment, the patient has consented to  Procedure(s): COLONOSCOPY (N/A) as a surgical intervention.  The patient's history has been reviewed, patient examined, no change in status, stable for surgery.  I have reviewed the patient's chart and labs.  Questions were answered to the patient's satisfaction.     [place], [place]

## 2023-02-14 NOTE — Anesthesia Preprocedure Evaluation (Signed)
Anesthesia Evaluation  Patient identified by MRN, date of birth, ID band Patient awake    Reviewed: Allergy & Precautions, NPO status , Patient's Chart, lab work & pertinent test results  History of Anesthesia Complications Negative for: history of anesthetic complications  Airway Mallampati: III  TM Distance: >3 FB Neck ROM: full    Dental  (+) Chipped   Pulmonary neg pulmonary ROS, neg shortness of breath   Pulmonary exam normal        Cardiovascular Exercise Tolerance: Good (-) angina negative cardio ROS Normal cardiovascular exam     Neuro/Psych negative neurological ROS  negative psych ROS   GI/Hepatic negative GI ROS, Neg liver ROS,neg GERD  ,,  Endo/Other  negative endocrine ROS    Renal/GU negative Renal ROS  negative genitourinary   Musculoskeletal   Abdominal   Peds  Hematology negative hematology ROS (+)   Anesthesia Other Findings Past Medical History: 03/31/2011: Bronchitis 07/29/2015: ED (erectile dysfunction) of organic origin No date: Elevated PSA No date: HLD (hyperlipidemia) 07/29/2015: Hypercholesterolemia  Past Surgical History: No date: COLONOSCOPY WITH PROPOFOL 07/21/2019: COLONOSCOPY WITH PROPOFOL; N/A     Comment:  Procedure: COLONOSCOPY WITH PROPOFOL;  Surgeon: [place],               Boykin Nearing, MD;  Location: [facility] ENDOSCOPY;  Service:               Gastroenterology;  Laterality: N/A; No date: None  BMI    Body Mass Index: 25.29 kg/m      Reproductive/Obstetrics negative OB ROS                             Anesthesia Physical Anesthesia Plan  ASA: 2  Anesthesia Plan: General   Post-op Pain Management:    Induction: Intravenous  PONV Risk Score and Plan: Propofol infusion and TIVA  Airway Management Planned: Natural Airway and Nasal Cannula  Additional Equipment:   Intra-op Plan:   Post-operative Plan:   Informed Consent: I have reviewed  the patients History and Physical, chart, labs and discussed the procedure including the risks, benefits and alternatives for the proposed anesthesia with the patient or authorized representative who has indicated his/her understanding and acceptance.     Dental Advisory Given  Plan Discussed with: Anesthesiologist, CRNA and Surgeon  Anesthesia Plan Comments: (Patient consented for risks of anesthesia including but not limited to:  - adverse reactions to medications - risk of airway placement if required - damage to eyes, teeth, lips or other oral mucosa - nerve damage due to positioning  - sore throat or hoarseness - Damage to heart, brain, nerves, lungs, other parts of body or loss of life  Patient voiced understanding.)       Anesthesia Quick Evaluation

## 2023-02-14 NOTE — Op Note (Signed)
[facility] Patient Name: Tyrone Mccormick Procedure Date: 02/14/2023 10:56 AM MRN: [id number] Account #: [id number] Date of Birth: Jul 27, 1961 Admit Type: Outpatient Age: 62 Room: [facility] ENDO ROOM 2 Gender: Male Note Status: Finalized Instrument Name: Prentice Docker [id number] Procedure:             Colonoscopy Indications:           Surveillance: Personal history of adenomatous polyps                         on last colonoscopy > 3 years ago Providers:             Royce Macadamia K. Norma Fredrickson MD, MD Referring MD:          Christen Butter (Referring MD) Medicines:             Propofol per Anesthesia Complications:         No immediate complications. Procedure:             Pre-Anesthesia Assessment:                        - The risks and benefits of the procedure and the                         sedation options and risks were discussed with the                         patient. All questions were answered and informed                         consent was obtained.                        - Patient identification and proposed procedure were                         verified prior to the procedure by the nurse. The                         procedure was verified in the procedure room.                        - ASA Grade Assessment: III - A patient with severe                         systemic disease.                        - After reviewing the risks and benefits, the patient                         was deemed in satisfactory condition to undergo the                         procedure.                        After obtaining informed consent, the colonoscope was                         passed  under direct vision. Throughout the procedure,                         the patient's blood pressure, pulse, and oxygen                         saturations were monitored continuously. The                         Colonoscope was introduced through the anus and                         advanced to  the the cecum, identified by appendiceal                         orifice and ileocecal valve. The colonoscopy was                         performed without difficulty. The patient tolerated                         the procedure well. The quality of the bowel                         preparation was good. The ileocecal valve, appendiceal                         orifice, and rectum were photographed. Findings:      The perianal and digital rectal examinations were normal. Pertinent       negatives include normal sphincter tone and no palpable rectal lesions.      Non-bleeding internal hemorrhoids were found during retroflexion. The       hemorrhoids were Grade I (internal hemorrhoids that do not prolapse).      Many small-mouthed diverticula were found in the sigmoid colon. There       was no evidence of diverticular bleeding.      The exam was otherwise without abnormality. Impression:            - Non-bleeding internal hemorrhoids.                        - Mild diverticulosis in the sigmoid colon. There was                         no evidence of diverticular bleeding.                        - The examination was otherwise normal.                        - No specimens collected. Recommendation:        - Patient has a contact number available for                         emergencies. The signs and symptoms of potential                         delayed complications were discussed with the patient.  Return to normal activities tomorrow. Written                         discharge instructions were provided to the patient.                        - Resume previous diet.                        - Continue present medications.                        - Repeat colonoscopy in 10 years for screening                         purposes.                        - Return to GI office PRN.                        - The findings and recommendations were discussed with                          the patient. Procedure Code(s):     --- Professional ---                        Z6109, Colorectal cancer screening; colonoscopy on                         individual at high risk Diagnosis Code(s):     --- Professional ---                        K57.30, Diverticulosis of large intestine without                         perforation or abscess without bleeding                        K64.0, First degree hemorrhoids                        Z86.010, Personal history of colonic polyps CPT copyright 2022 American Medical Association. All rights reserved. The codes documented in this report are preliminary and upon coder review may  be revised to meet current compliance requirements. Stanton Kidney MD, MD 02/14/2023 11:33:31 AM This report has been signed electronically. Number of Addenda: 0 Note Initiated On: 02/14/2023 10:56 AM Scope Withdrawal Time: 0 hours 7 minutes 33 seconds  Total Procedure Duration: 0 hours 12 minutes 20 seconds  Estimated Blood Loss:  Estimated blood loss: none.      [facility]

## 2023-02-15 ENCOUNTER — Ambulatory Visit: Payer: 59 | Admitting: Dermatology

## 2023-02-15 ENCOUNTER — Encounter: Payer: Self-pay | Admitting: Internal Medicine

## 2023-02-15 ENCOUNTER — Other Ambulatory Visit (HOSPITAL_COMMUNITY): Payer: Self-pay

## 2023-02-15 VITALS — BP 105/67 | HR 65

## 2023-02-15 DIAGNOSIS — Z7189 Other specified counseling: Secondary | ICD-10-CM

## 2023-02-15 DIAGNOSIS — Z79899 Other long term (current) drug therapy: Secondary | ICD-10-CM | POA: Diagnosis not present

## 2023-02-15 DIAGNOSIS — L409 Psoriasis, unspecified: Secondary | ICD-10-CM | POA: Diagnosis not present

## 2023-02-15 MED ORDER — OTEZLA 30 MG PO TABS
30.0000 mg | ORAL_TABLET | Freq: Every day | ORAL | 6 refills | Status: DC
Start: 2023-02-15 — End: 2023-04-04
  Filled 2023-02-15 – 2023-02-27 (×2): qty 30, 30d supply, fill #0
  Filled 2023-03-28: qty 30, 30d supply, fill #1

## 2023-02-15 NOTE — Progress Notes (Signed)
   Follow-Up Visit   Subjective  Tyrone Mccormick is a 62 y.o. male who presents for the following: 6 month psoriasis follow up, reports staying clear on Otezla 30 mg 4 days a week  Eucrisa ointment daily and Triamcinolone cream prn flares only.   The following portions of the chart were reviewed this encounter and updated as appropriate: medications, allergies, medical history  Review of Systems:  No other skin or systemic complaints except as noted in HPI or Assessment and Plan.  Objective  Well appearing patient in no apparent distress; mood and affect are within normal limits. Areas Examined: B/l hands  Relevant exam findings are noted in the Assessment and Plan.   Assessment & Plan   Psoriasis  Hands Mostly Clear today  BSA 1 %  Chronic and persistent condition with duration or expected duration over one year. Condition is symptomatic / bothersome to patient. Not to goal, but much improved and mostly clear. on [place]   Psoriasis improved on current regimen  Counseling on psoriasis and coordination of care  psoriasis is a chronic non-curable, but treatable genetic/hereditary disease that may have other systemic features affecting other organ systems such as joints (Psoriatic Arthritis). It is associated with an increased risk of inflammatory bowel disease, heart disease, non-alcoholic fatty liver disease, and depression.  Treatments include light and laser treatments; topical medications; and systemic medications including oral and injectables.    No side effects like depression or GI Issues    Continue Otezla 30 mg 4 days a week  Continue Eucrisa ointment daily  Continue Triamcinolone cream prn flares only   Long term medication management.  Patient is using long term (months to years) prescription medication  to control their dermatologic condition.  These medications require periodic monitoring to evaluate for efficacy and side effects and may require periodic laboratory  monitoring.   Side effects of Otezla (apremilast) include diarrhea, nausea, headache, upper respiratory infection, depression, and weight decrease (5-10%). It should only be taken by pregnant women after a discussion regarding risks and benefits with their doctor. Goal is control of skin condition, not cure.  The use of [name] requires long term medication management, including periodic office visits.    Topical steroids (such as triamcinolone, fluocinolone, fluocinonide, mometasone, clobetasol, halobetasol, betamethasone, hydrocortisone) can cause thinning and lightening of the skin if they are used for too long in the same area. Your physician has selected the right strength medicine for your problem and area affected on the body. Please use your medication only as directed by your physician to prevent side effects.    Return in about 6 months (around 08/18/2023) for psoriasis.  IAsher Muir, CMA, am acting as scribe for Armida Sans, MD.  Documentation: I have reviewed the above documentation for accuracy and completeness, and I agree with the above.  Armida Sans, MD

## 2023-02-15 NOTE — Patient Instructions (Addendum)
Continue Otezla 30 mg 4 days a week  Continue Eucrisa ointment daily  Continue Triamcinolone cream prn flares only  Topical steroids (such as triamcinolone, fluocinolone, fluocinonide, mometasone, clobetasol, halobetasol, betamethasone, hydrocortisone) can cause thinning and lightening of the skin if they are used for too long in the same area. Your physician has selected the right strength medicine for your problem and area affected on the body. Please use your medication only as directed by your physician to prevent side effects.    Side effects of Otezla (apremilast) include diarrhea, nausea, headache, upper respiratory infection, depression, and weight decrease (5-10%). It should only be taken by pregnant women after a discussion regarding risks and benefits with their doctor. Goal is control of skin condition, not cure.  The use of [name] requires long term medication management, including periodic office visits.      Due to recent changes in healthcare laws, you may see results of your pathology and/or laboratory studies on MyChart before the doctors have had a chance to review them. We understand that in some cases there may be results that are confusing or concerning to you. Please understand that not all results are received at the same time and often the doctors may need to interpret multiple results in order to provide you with the best plan of care or course of treatment. Therefore, we ask that you please give [place] 2 business days to thoroughly review all your results before contacting the office for clarification. Should we see a critical lab result, you will be contacted sooner.   If You Need Anything After Your Visit  If you have any questions or concerns for your doctor, please call our main line at [phone] and press option 4 to reach your doctor's medical assistant. If no one answers, please leave a voicemail as directed and we will return your call as soon as possible.  Messages left after 4 pm will be answered the following business day.   You may also send [place] a message via MyChart. We typically respond to MyChart messages within 1-2 business days.  For prescription refills, please ask your pharmacy to contact our office. Our fax number is [phone].  If you have an urgent issue when the clinic is closed that cannot wait until the next business day, you can page your doctor at the number below.    Please note that while we do our best to be available for urgent issues outside of office hours, we are not available 24/7.   If you have an urgent issue and are unable to reach [place], you may choose to seek medical care at your doctor's office, retail clinic, urgent care center, or emergency room.  If you have a medical emergency, please immediately call 911 or go to the emergency department.  Pager Numbers  - Dr. Gwen Pounds: [phone]  - Dr. Neale Burly: [phone]  - Dr. Roseanne Reno: [phone]  In the event of inclement weather, please call our main line at [phone] for an update on the status of any delays or closures.  Dermatology Medication Tips: Please keep the boxes that topical medications come in in order to help keep track of the instructions about where and how to use these. Pharmacies typically print the medication instructions only on the boxes and not directly on the medication tubes.   If your medication is too expensive, please contact our office at [phone] option 4 or send [place] a message through MyChart.   We are unable to tell what your  co-pay for medications will be in advance as this is different depending on your insurance coverage. However, we may be able to find a substitute medication at lower cost or fill out paperwork to get insurance to cover a needed medication.   If a prior authorization is required to get your medication covered by your insurance company, please allow [place] 1-2 business days to complete this process.  Drug  prices often vary depending on where the prescription is filled and some pharmacies may offer cheaper prices.  The website www.goodrx.com contains coupons for medications through different pharmacies. The prices here do not account for what the cost may be with help from insurance (it may be cheaper with your insurance), but the website can give you the price if you did not use any insurance.  - You can print the associated coupon and take it with your prescription to the pharmacy.  - You may also stop by our office during regular business hours and pick up a GoodRx coupon card.  - If you need your prescription sent electronically to a different pharmacy, notify our office through [facility] or by phone at [phone] option 4.     Si Usted Necesita Algo Despus de Su Visita  Tambin puede enviarnos un mensaje a travs de Clinical cytogeneticist. Por lo general respondemos a los mensajes de MyChart en el transcurso de 1 a 2 das hbiles.  Para renovar recetas, por favor pida a su farmacia que se ponga en contacto con nuestra oficina. Annie Sable de fax es [place] [phone].  Si tiene un asunto urgente cuando la clnica est cerrada y que no puede esperar hasta el siguiente da hbil, puede llamar/localizar a su doctor(a) al nmero que aparece a continuacin.   Por favor, tenga en cuenta que aunque hacemos todo lo posible para estar disponibles para asuntos urgentes fuera del horario de [place], no estamos disponibles las 24 horas del da, los 7 [address]  [address] de la [place].   Si tiene un problema urgente y no puede comunicarse con nosotros, puede optar por buscar atencin mdica  en el consultorio de su doctor(a), en una clnica privada, en un centro de atencin urgente o en una sala de emergencias.  Si tiene Engineer, drilling, por favor llame inmediatamente al 911 o vaya a la sala de emergencias.  Nmeros de bper  - Dr. Gwen Pounds: [phone]  - Dra. Moye: [phone]  - Dra. Roseanne Reno:  [phone]  En caso de inclemencias del [place], por favor llame a Lacy Duverney principal al [phone] para una actualizacin sobre el [place] de cualquier retraso o cierre.  Consejos para la medicacin en dermatologa: Por favor, guarde las cajas en las que vienen los medicamentos de uso tpico para ayudarle a seguir las instrucciones sobre dnde y cmo usarlos. Las farmacias generalmente imprimen las instrucciones del medicamento slo en las cajas y no directamente en los tubos del [place].   Si su medicamento es muy caro, por favor, pngase en contacto con Rolm Gala llamando al [phone] y presione la opcin 4 o envenos un mensaje a travs de Clinical cytogeneticist.   No podemos decirle cul ser su copago por los medicamentos por adelantado ya que esto es diferente dependiendo de la cobertura de su seguro. Sin embargo, es posible que podamos encontrar un medicamento sustituto a Audiological scientist un formulario para que el seguro cubra el medicamento que se considera necesario.   Si se requiere una autorizacin previa para que su compaa de seguros [place] su medicamento, por  favor permtanos de 1 a 2 das hbiles para completar [address]  Los precios de los medicamentos varan con frecuencia dependiendo del Environmental consultant de dnde se surte la receta y alguna farmacias pueden ofrecer precios ms baratos.  El sitio web www.goodrx.com tiene cupones para medicamentos de Health and safety inspector. Los precios aqu no tienen en cuenta lo que podra costar con la ayuda del seguro (puede ser ms barato con su seguro), pero el sitio web puede darle el precio si no utiliz Tourist information centre manager.  - Puede imprimir el cupn correspondiente y llevarlo con su receta a la farmacia.  - Tambin puede pasar por nuestra oficina durante el horario de atencin regular y Education officer, museum una tarjeta de cupones de GoodRx.  - Si necesita que su receta se enve electrnicamente a una farmacia diferente, informe a nuestra oficina a travs de  MyChart de [place] o por telfono llamando al [phone] y presione la opcin 4.

## 2023-02-16 ENCOUNTER — Encounter: Payer: Self-pay | Admitting: Dermatology

## 2023-02-26 ENCOUNTER — Other Ambulatory Visit: Payer: Self-pay

## 2023-02-27 ENCOUNTER — Other Ambulatory Visit (HOSPITAL_COMMUNITY): Payer: Self-pay

## 2023-03-02 ENCOUNTER — Other Ambulatory Visit: Payer: Self-pay

## 2023-03-11 ENCOUNTER — Other Ambulatory Visit: Payer: Self-pay

## 2023-03-11 MED FILL — Sildenafil Citrate Tab 20 MG: ORAL | 8 days supply | Qty: 40 | Fill #1 | Status: AC

## 2023-03-28 ENCOUNTER — Other Ambulatory Visit: Payer: Self-pay | Admitting: Medical-Surgical

## 2023-03-28 ENCOUNTER — Other Ambulatory Visit: Payer: Self-pay

## 2023-03-28 ENCOUNTER — Other Ambulatory Visit (HOSPITAL_COMMUNITY): Payer: Self-pay

## 2023-03-28 MED ORDER — ATORVASTATIN CALCIUM 20 MG PO TABS
20.0000 mg | ORAL_TABLET | Freq: Every day | ORAL | 0 refills | Status: DC
  Filled 2023-03-28: qty 90, 90d supply, fill #0

## 2023-04-04 ENCOUNTER — Other Ambulatory Visit: Payer: Self-pay | Admitting: Pharmacist

## 2023-04-04 ENCOUNTER — Other Ambulatory Visit (HOSPITAL_COMMUNITY): Payer: Self-pay

## 2023-04-04 DIAGNOSIS — L409 Psoriasis, unspecified: Secondary | ICD-10-CM

## 2023-04-04 MED ORDER — OTEZLA 30 MG PO TABS
30.0000 mg | ORAL_TABLET | Freq: Every day | ORAL | 6 refills | Status: DC
Start: 2023-04-04 — End: 2024-04-09
  Filled 2023-04-04: qty 30, 30d supply, fill #0
  Filled 2023-07-17 – 2023-08-31 (×2): qty 30, 30d supply, fill #1
  Filled 2023-09-25: qty 30, 30d supply, fill #2
  Filled 2023-12-24 (×2): qty 30, 30d supply, fill #3
  Filled 2024-01-21 – 2024-01-25 (×3): qty 30, 30d supply, fill #4
  Filled 2024-02-28 – 2024-03-04 (×2): qty 30, 30d supply, fill #5
  Filled 2024-04-01: qty 30, 30d supply, fill #6

## 2023-04-05 ENCOUNTER — Other Ambulatory Visit (HOSPITAL_COMMUNITY): Payer: Self-pay

## 2023-04-05 ENCOUNTER — Other Ambulatory Visit: Payer: Self-pay

## 2023-04-11 ENCOUNTER — Other Ambulatory Visit: Payer: Self-pay

## 2023-04-11 ENCOUNTER — Other Ambulatory Visit (HOSPITAL_COMMUNITY): Payer: Self-pay

## 2023-04-11 ENCOUNTER — Ambulatory Visit (INDEPENDENT_AMBULATORY_CARE_PROVIDER_SITE_OTHER): Payer: 59 | Admitting: Medical-Surgical

## 2023-04-11 ENCOUNTER — Encounter: Payer: Self-pay | Admitting: Medical-Surgical

## 2023-04-11 VITALS — BP 104/69 | HR 63 | Ht 70.0 in | Wt 179.0 lb

## 2023-04-11 DIAGNOSIS — Z125 Encounter for screening for malignant neoplasm of prostate: Secondary | ICD-10-CM

## 2023-04-11 DIAGNOSIS — N529 Male erectile dysfunction, unspecified: Secondary | ICD-10-CM

## 2023-04-11 DIAGNOSIS — E7800 Pure hypercholesterolemia, unspecified: Secondary | ICD-10-CM

## 2023-04-11 DIAGNOSIS — Z0000 Encounter for general adult medical examination without abnormal findings: Secondary | ICD-10-CM | POA: Diagnosis not present

## 2023-04-11 MED ORDER — ATORVASTATIN CALCIUM 20 MG PO TABS
20.0000 mg | ORAL_TABLET | Freq: Every day | ORAL | 3 refills | Status: DC
  Filled 2023-04-11 – 2023-06-27 (×2): qty 90, 90d supply, fill #0
  Filled 2023-09-23: qty 90, 90d supply, fill #1
  Filled 2024-01-02: qty 90, 90d supply, fill #2
  Filled 2024-03-28: qty 90, 90d supply, fill #3

## 2023-04-11 MED ORDER — SILDENAFIL CITRATE 20 MG PO TABS
20.0000 mg | ORAL_TABLET | Freq: Every day | ORAL | 3 refills | Status: DC | PRN
  Filled 2023-04-11: qty 40, 10d supply, fill #0
  Filled 2023-08-14: qty 40, 10d supply, fill #1
  Filled 2023-12-07: qty 10, 30d supply, fill #2
  Filled 2024-03-05: qty 10, 30d supply, fill #3

## 2023-04-11 NOTE — Progress Notes (Signed)
Complete physical exam  Patient: Tyrone Mccormick   DOB: 26-Mar-1961   62 y.o. Male  MRN: [id number]  Subjective:    Chief Complaint  Patient presents with   Annual Exam    Tyrone Mccormick is a 62 y.o. male who presents today for a complete physical exam. He reports consuming a general diet.  Exercising regularly with strength/cardio at least 3 times weekly.  He generally feels well. He reports sleeping well. He does have additional problems to discuss today.    Most recent fall risk assessment:    04/11/2023    8:37 AM  Fall Risk   Falls in the past year? 0  Number falls in past yr: 0  Injury with Fall? 0  Risk for fall due to : No Fall Risks  Follow up Falls evaluation completed     Most recent depression screenings:    04/11/2023    8:37 AM 06/21/2022    8:56 AM  PHQ 2/9 Scores  PHQ - 2 Score 0 0  PHQ- 9 Score  0    Vision:Within last year, Dental: No current dental problems and Receives regular dental care, STD: The patient denies history of sexually transmitted disease., and PSA: Agrees to PSA testing    Patient Care Team: Christen Butter, NP as PCP - General (Nurse Practitioner)   Outpatient Medications Prior to Visit  Medication Sig   Apremilast (OTEZLA) 30 MG TABS Take 1 tablet (30 mg total) by mouth daily.   aspirin 81 MG tablet Take 81 mg by mouth daily. Reported on 08/18/2015   azelastine (ASTELIN) 0.1 % nasal spray Place into the nose.   cetirizine (ZYRTEC) 10 MG tablet Take 10 mg by mouth daily. Reported on 08/18/2015   Crisaborole 2 % OINT Apply 1 Application topically 1 (one) to 2 (two) times daily on hands and feet as needed for flares   MELATONIN PO Take by mouth daily.   Multiple Vitamin (MULTIVITAMIN) tablet Take 1 tablet by mouth daily.   [DISCONTINUED] atorvastatin (LIPITOR) 20 MG tablet Take 1 tablet (20 mg total) by mouth daily.   [DISCONTINUED] sildenafil (REVATIO) 20 MG tablet Take 1-5 tablets (20-100 mg total) by mouth daily as needed.   [DISCONTINUED]  sildenafil (REVATIO) 20 MG tablet TAKE AS DIRECTED BY DOCTOR FOR ED   [DISCONTINUED] sildenafil (REVATIO) 20 MG tablet 4 tablets Orally Once a day as needed 30 day(s)   [DISCONTINUED] sildenafil (VIAGRA) 25 MG tablet Take 25 mg by mouth daily as needed for erectile dysfunction.   No facility-administered medications prior to visit.    Review of Systems  Constitutional:  Negative for chills, fever, malaise/fatigue and weight loss.  HENT:  Negative for congestion, ear pain, hearing loss, sinus pain and sore throat.   Eyes:  Negative for blurred vision, photophobia and pain.  Respiratory:  Negative for cough, shortness of breath and wheezing.   Cardiovascular:  Negative for chest pain, palpitations and leg swelling.  Gastrointestinal:  Negative for abdominal pain, constipation, diarrhea, heartburn, nausea and vomiting.  Genitourinary:  Negative for dysuria, frequency and urgency.  Musculoskeletal:  Negative for falls and neck pain.  Skin:  Negative for itching and rash.  Neurological:  Negative for dizziness, weakness and headaches.  Endo/Heme/Allergies:  Negative for polydipsia. Does not bruise/bleed easily.  Psychiatric/Behavioral:  Negative for depression, substance abuse and suicidal ideas. The patient is not nervous/anxious.      Objective:    BP 104/69   Pulse 63   Ht 5'  10" (1.778 m)   Wt 179 lb 0.6 oz (81.2 kg)   SpO2 98%   BMI 25.69 kg/m    Physical Exam Vitals reviewed.  Constitutional:      General: He is not in acute distress.    Appearance: Normal appearance. He is not ill-appearing.  HENT:     Head: Normocephalic and atraumatic.     Right Ear: Tympanic membrane, ear canal and external ear normal. There is no impacted cerumen.     Left Ear: Tympanic membrane, ear canal and external ear normal. There is no impacted cerumen.     Nose: Nose normal. No congestion or rhinorrhea.     Mouth/Throat:     Mouth: Mucous membranes are moist.     Pharynx: No oropharyngeal  exudate or posterior oropharyngeal erythema.  Eyes:     General: No scleral icterus.       Right eye: No discharge.        Left eye: No discharge.     Extraocular Movements: Extraocular movements intact.     Conjunctiva/sclera: Conjunctivae normal.     Pupils: Pupils are equal, round, and reactive to light.  Neck:     Thyroid: No thyromegaly.     Vascular: No carotid bruit or JVD.     Trachea: Trachea normal.  Cardiovascular:     Rate and Rhythm: Normal rate and regular rhythm.     Pulses: Normal pulses.     Heart sounds: Normal heart sounds. No murmur heard.    No friction rub. No gallop.  Pulmonary:     Effort: Pulmonary effort is normal. No respiratory distress.     Breath sounds: Normal breath sounds. No wheezing.  Abdominal:     General: Bowel sounds are normal. There is no distension.     Palpations: Abdomen is soft.     Tenderness: There is no abdominal tenderness. There is no guarding.  Musculoskeletal:        General: Normal range of motion.     Cervical back: Normal range of motion and neck supple.  Lymphadenopathy:     Cervical: No cervical adenopathy.  Skin:    General: Skin is warm and dry.  Neurological:     Mental Status: He is alert and oriented to person, place, and time.     Cranial Nerves: No cranial nerve deficit.  Psychiatric:        Mood and Affect: Mood normal.        Behavior: Behavior normal.        Thought Content: Thought content normal.        Judgment: Judgment normal.   No results found for any visits on 04/11/23.     Assessment & Plan:    Routine Health Maintenance and Physical Exam  Immunization History  Administered Date(s) Administered   DT (Pediatric) 03/20/2008   Influenza-Unspecified 04/16/2015, 04/18/2018, 04/24/2022   PFIZER(Purple Top)SARS-COV-2 Vaccination 07/08/2019, 08/08/2019   Tdap 07/09/2018   Zoster Recombinant(Shingrix) 09/19/2019, 02/20/2020    Health Maintenance  Topic Date Due   COVID-19 Vaccine (3 - Pfizer  risk series) 09/05/2019   INFLUENZA VACCINE  03/08/2023   DTaP/Tdap/Td (3 - Td or Tdap) 07/09/2028   Colonoscopy  02/13/2033   Zoster Vaccines- Shingrix  Completed   HPV VACCINES  Aged Out   Hepatitis C Screening  Discontinued   HIV Screening  Discontinued    Discussed health benefits of physical activity, and encouraged him to engage in regular exercise appropriate for his age and condition.  1. Annual physical exam Checking labs today.  Up-to-date on preventative care.  Wellness information provided with AVS.  2. Hypercholesterolemia Checking lipids.  Continue atorvastatin 20 mg daily.  3. Prostate cancer screening Checking PSA.  4. ED (erectile dysfunction) of organic origin Continue sildenafil as prescribed.  Discussed possibly switching to Cialis in the setting of known BPH. He will review the medication information and let me know if he'd like to give this a try.   Return in about 1 year (around 04/10/2024) for annual physical exam or sooner if needed.   Christen Butter, NP

## 2023-04-12 LAB — CBC WITH DIFFERENTIAL/PLATELET
Basophils Absolute: 0 10*3/uL (ref 0.0–0.2)
Basos: 1 %
EOS (ABSOLUTE): 0.1 10*3/uL (ref 0.0–0.4)
Eos: 2 %
Hematocrit: 50.2 % (ref 37.5–51.0)
Hemoglobin: 16.7 g/dL (ref 13.0–17.7)
Immature Grans (Abs): 0 10*3/uL (ref 0.0–0.1)
Immature Granulocytes: 0 %
Lymphocytes Absolute: 1.3 10*3/uL (ref 0.7–3.1)
Lymphs: 23 %
MCH: 31.2 pg (ref 26.6–33.0)
MCHC: 33.3 g/dL (ref 31.5–35.7)
MCV: 94 fL (ref 79–97)
Monocytes Absolute: 0.5 10*3/uL (ref 0.1–0.9)
Monocytes: 10 %
Neutrophils Absolute: 3.6 10*3/uL (ref 1.4–7.0)
Neutrophils: 64 %
Platelets: 288 10*3/uL (ref 150–450)
RBC: 5.36 x10E6/uL (ref 4.14–5.80)
RDW: 12.2 % (ref 11.6–15.4)
WBC: 5.6 10*3/uL (ref 3.4–10.8)

## 2023-04-12 LAB — CMP14+EGFR
ALT: 23 IU/L (ref 0–44)
AST: 22 IU/L (ref 0–40)
Albumin: 4.3 g/dL (ref 3.8–4.9)
Alkaline Phosphatase: 80 IU/L (ref 44–121)
BUN/Creatinine Ratio: 16 (ref 10–24)
BUN: 15 mg/dL (ref 8–27)
Bilirubin Total: 1.4 mg/dL — ABNORMAL HIGH (ref 0.0–1.2)
CO2: 24 mmol/L (ref 20–29)
Calcium: 9.1 mg/dL (ref 8.6–10.2)
Chloride: 103 mmol/L (ref 96–106)
Creatinine, Ser: 0.91 mg/dL (ref 0.76–1.27)
Globulin, Total: 1.7 g/dL (ref 1.5–4.5)
Glucose: 85 mg/dL (ref 70–99)
Potassium: 4.5 mmol/L (ref 3.5–5.2)
Sodium: 140 mmol/L (ref 134–144)
Total Protein: 6 g/dL (ref 6.0–8.5)
eGFR: 96 mL/min/{1.73_m2} (ref >59)

## 2023-04-12 LAB — FPSA% REFLEX
% FREE PSA: 11.7 %
PSA, FREE: 0.91 ng/mL

## 2023-04-12 LAB — LIPID PANEL
Chol/HDL Ratio: 3.6 ratio (ref 0.0–5.0)
Cholesterol, Total: 154 mg/dL (ref 100–199)
HDL: 43 mg/dL (ref >39)
LDL Chol Calc (NIH): 97 mg/dL (ref 0–99)
Triglycerides: 72 mg/dL (ref 0–149)
VLDL Cholesterol Cal: 14 mg/dL (ref 5–40)

## 2023-04-12 LAB — PSA TOTAL (REFLEX TO FREE): Prostate Specific Ag, Serum: 7.8 ng/mL — ABNORMAL HIGH (ref 0.0–4.0)

## 2023-04-28 ENCOUNTER — Encounter (HOSPITAL_COMMUNITY): Payer: Self-pay

## 2023-06-27 ENCOUNTER — Other Ambulatory Visit: Payer: Self-pay

## 2023-07-10 ENCOUNTER — Telehealth: Payer: Self-pay | Admitting: Pharmacist

## 2023-07-10 NOTE — Telephone Encounter (Signed)
Called patient to schedule an appointment for the [employer] [facility]. I was unable to reach the patient so I left a HIPAA-compliant message requesting that the patient return my call.   Luke Van Ausdall, PharmD, BCACP, CPP Clinical Pharmacist [facility] [phone]  

## 2023-07-17 ENCOUNTER — Other Ambulatory Visit (HOSPITAL_COMMUNITY): Payer: Self-pay

## 2023-07-17 ENCOUNTER — Other Ambulatory Visit: Payer: Self-pay

## 2023-07-19 ENCOUNTER — Other Ambulatory Visit: Payer: Self-pay

## 2023-08-14 ENCOUNTER — Other Ambulatory Visit: Payer: Self-pay

## 2023-08-20 ENCOUNTER — Other Ambulatory Visit: Payer: Self-pay

## 2023-08-22 ENCOUNTER — Ambulatory Visit: Payer: 59 | Admitting: Dermatology

## 2023-08-22 ENCOUNTER — Other Ambulatory Visit: Payer: Self-pay

## 2023-08-22 NOTE — Progress Notes (Signed)
 Patient called back to report that he is taking 1 tablet of Otezla  every 1-3 days and would like to wait to refill the medication until his appointment with the prescriber on 1/30. Patient is aware that he is can call pharmacy beforehand if needed. Updated [employer] and ran test claim and copay is $0.00. Pharmacy will follow up with patient after appointment.

## 2023-08-31 ENCOUNTER — Other Ambulatory Visit: Payer: Self-pay

## 2023-08-31 NOTE — Progress Notes (Signed)
Specialty Pharmacy Refill Coordination Note  Tyrone Mccormick is a 63 y.o. male contacted today regarding refills of specialty medication(s) Apremilast [name])   Patient requested Delivery   Delivery date: 09/04/23   Verified address: [address] [place] [address]   Medication will be filled on 01.27.25.

## 2023-09-03 ENCOUNTER — Other Ambulatory Visit: Payer: Self-pay

## 2023-09-04 ENCOUNTER — Other Ambulatory Visit (HOSPITAL_COMMUNITY): Payer: Self-pay

## 2023-09-05 ENCOUNTER — Other Ambulatory Visit: Payer: Self-pay

## 2023-09-06 ENCOUNTER — Ambulatory Visit: Payer: Commercial Managed Care - PPO | Admitting: Dermatology

## 2023-09-06 DIAGNOSIS — L409 Psoriasis, unspecified: Secondary | ICD-10-CM | POA: Diagnosis not present

## 2023-09-06 DIAGNOSIS — Z79899 Other long term (current) drug therapy: Secondary | ICD-10-CM | POA: Diagnosis not present

## 2023-09-06 DIAGNOSIS — Z7189 Other specified counseling: Secondary | ICD-10-CM | POA: Diagnosis not present

## 2023-09-06 NOTE — Progress Notes (Signed)
   Follow-Up Visit   Subjective  Tyrone Mccormick is a 64 y.o. male who presents for the following: psoriasis at hands. Patient currently on Otezla 30 mg once a day every other day. Uses Eucrisa and TMC 0.1% cr nightly as needed for flares.   The patient has spots, moles and lesions to be evaluated, some may be new or changing and the patient may have concern these could be cancer.   The following portions of the chart were reviewed this encounter and updated as appropriate: medications, allergies, medical history  Review of Systems:  No other skin or systemic complaints except as noted in HPI or Assessment and Plan.  Objective  Well appearing patient in no apparent distress; mood and affect are within normal limits.   A focused examination was performed of the following areas: hands  Relevant exam findings are noted in the Assessment and Plan.    Assessment & Plan   Psoriasis  Exam: Hands clear, Xerosis at fingertips BSA 1 %   Chronic condition with duration or expected duration over one year. Currently well-controlled on Otezla    Counseling on psoriasis and coordination of care  psoriasis is a chronic non-curable, but treatable genetic/hereditary disease that may have other systemic features affecting other organ systems such as joints (Psoriatic Arthritis). It is associated with an increased risk of inflammatory bowel disease, heart disease, non-alcoholic fatty liver disease, and depression.  Treatments include light and laser treatments; topical medications; and systemic medications including oral and injectables.    No side effects like depression or GI Issues    Continue Otezla 30 mg every other day, may increase to daily prn flares  Continue Eucrisa ointment daily prn Continue Triamcinolone cream prn flares only Patient will call for refills. Consider sending Tyrone Mccormick to [facility] if less expensive.    Long term medication management.  Patient is using long term (months to  years) prescription medication  to control their dermatologic condition.  These medications require periodic monitoring to evaluate for efficacy and side effects and may require periodic laboratory monitoring.   Side effects of Otezla (apremilast) include diarrhea, nausea, headache, upper respiratory infection, depression, and weight decrease (5-10%). It should only be taken by pregnant women after a discussion regarding risks and benefits with their doctor. Goal is control of skin condition, not cure.  The use of Tyrone Mccormick requires long term medication management, including periodic office visits.    Topical steroids (such as triamcinolone, fluocinolone, fluocinonide, mometasone, clobetasol, halobetasol, betamethasone, hydrocortisone) can cause thinning and lightening of the skin if they are used for too long in the same area. Your physician has selected the right strength medicine for your problem and area affected on the body. Please use your medication only as directed by your physician to prevent side effects.    Return in about 6 months (around 03/05/2024) for with Tyrone Mccormick, Psoriasis.  Tyrone Mccormick, RMA, am acting as scribe for Tyrone Niece, MD .   Documentation: I have reviewed the above documentation for accuracy and completeness, and I agree with the above.  Tyrone Niece, MD

## 2023-09-06 NOTE — Patient Instructions (Signed)
Side effects of Otezla (apremilast) include diarrhea, nausea, headache, upper respiratory infection, depression, and weight decrease (5-10%). It should only be taken by pregnant women after a discussion regarding risks and benefits with their doctor. Goal is control of skin condition, not cure.  The use of [name] requires long term medication management, including periodic office visits.  Due to recent changes in healthcare laws, you may see results of your pathology and/or laboratory studies on MyChart before the doctors have had a chance to review them. We understand that in some cases there may be results that are confusing or concerning to you. Please understand that not all results are received at the same time and often the doctors may need to interpret multiple results in order to provide you with the best plan of care or course of treatment. Therefore, we ask that you please give [place] 2 business days to thoroughly review all your results before contacting the office for clarification. Should we see a critical lab result, you will be contacted sooner.   If You Need Anything After Your Visit  If you have any questions or concerns for your doctor, please call our main line at [phone] and press option 4 to reach your doctor's medical assistant. If no one answers, please leave a voicemail as directed and we will return your call as soon as possible. Messages left after 4 pm will be answered the following business day.   You may also send [place] a message via MyChart. We typically respond to MyChart messages within 1-2 business days.  For prescription refills, please ask your pharmacy to contact our office. Our fax number is [phone].  If you have an urgent issue when the clinic is closed that cannot wait until the next business day, you can page your doctor at the number below.    Please note that while we do our best to be available for urgent issues outside of office hours, we are not  available 24/7.   If you have an urgent issue and are unable to reach [place], you may choose to seek medical care at your doctor's office, retail clinic, urgent care center, or emergency room.  If you have a medical emergency, please immediately call 911 or go to the emergency department.  Pager Numbers  - Dr. Gwen Pounds: [phone]  - Dr. Roseanne Reno: [phone]  - Dr. Katrinka Blazing: [phone]   In the event of inclement weather, please call our main line at [phone] for an update on the status of any delays or closures.  Dermatology Medication Tips: Please keep the boxes that topical medications come in in order to help keep track of the instructions about where and how to use these. Pharmacies typically print the medication instructions only on the boxes and not directly on the medication tubes.   If your medication is too expensive, please contact our office at [phone] option 4 or send [place] a message through MyChart.   We are unable to tell what your co-pay for medications will be in advance as this is different depending on your insurance coverage. However, we may be able to find a substitute medication at lower cost or fill out paperwork to get insurance to cover a needed medication.   If a prior authorization is required to get your medication covered by your insurance company, please allow [place] 1-2 business days to complete this process.  Drug prices often vary depending on where the prescription is filled and some pharmacies may offer cheaper prices.  The  website www.goodrx.com contains coupons for medications through different pharmacies. The prices here do not account for what the cost may be with help from insurance (it may be cheaper with your insurance), but the website can give you the price if you did not use any insurance.  - You can print the associated coupon and take it with your prescription to the pharmacy.  - You may also stop by our office during regular business hours  and pick up a GoodRx coupon card.  - If you need your prescription sent electronically to a different pharmacy, notify our office through [facility] or by phone at [phone] option 4.     Si Usted Necesita Algo Despus de Su Visita  Tambin puede enviarnos un mensaje a travs de Clinical cytogeneticist. Por lo general respondemos a los mensajes de MyChart en el transcurso de 1 a 2 das hbiles.  Para renovar recetas, por favor pida a su farmacia que se ponga en contacto con nuestra oficina. Annie Sable de fax es [place] [phone].  Si tiene un asunto urgente cuando la clnica est cerrada y que no puede esperar hasta el siguiente da hbil, puede llamar/localizar a su doctor(a) al nmero que aparece a continuacin.   Por favor, tenga en cuenta que aunque hacemos todo lo posible para estar disponibles para asuntos urgentes fuera del horario de [place], no estamos disponibles las 24 horas del da, los 7 [address]  [address] de la [place].   Si tiene un problema urgente y no puede comunicarse con nosotros, puede optar por buscar atencin mdica  en el consultorio de su doctor(a), en una clnica privada, en un centro de atencin urgente o en una sala de emergencias.  Si tiene Engineer, drilling, por favor llame inmediatamente al 911 o vaya a la sala de emergencias.  Nmeros de bper  - Dr. Gwen Pounds: [phone]  - Dra. Roseanne Reno: [phone]  - Dr. Katrinka Blazing: [phone]   En caso de inclemencias del tiempo, por favor llame a Lacy Duverney principal al [phone] para una actualizacin sobre el [place] de cualquier retraso o cierre.  Consejos para la medicacin en dermatologa: Por favor, guarde las cajas en las que vienen los medicamentos de uso tpico para ayudarle a seguir las instrucciones sobre dnde y cmo usarlos. Las farmacias generalmente imprimen las instrucciones del medicamento slo en las cajas y no directamente en los tubos del [place].   Si su medicamento es muy caro, por favor, pngase  en contacto con Rolm Gala llamando al [phone] y presione la opcin 4 o envenos un mensaje a travs de Clinical cytogeneticist.   No podemos decirle cul ser su copago por los medicamentos por adelantado ya que esto es diferente dependiendo de la cobertura de su seguro. Sin embargo, es posible que podamos encontrar un medicamento sustituto a Audiological scientist un formulario para que el seguro cubra el medicamento que se considera necesario.   Si se requiere una autorizacin previa para que su compaa de seguros [place] su medicamento, por favor permtanos de 1 a 2 das hbiles para completar [address]  Los precios de los medicamentos varan con frecuencia dependiendo del Environmental consultant de dnde se surte la receta y alguna farmacias pueden ofrecer precios ms baratos.  El sitio web www.goodrx.com tiene cupones para medicamentos de Health and safety inspector. Los precios aqu no tienen en cuenta lo que podra costar con la ayuda del seguro (puede ser ms barato con su seguro), pero el sitio web puede darle el precio si no utiliz Tourist information centre manager.  -  Puede imprimir el cupn correspondiente y llevarlo con su receta a la farmacia.  - Tambin puede pasar por nuestra oficina durante el horario de atencin regular y Education officer, museum una tarjeta de cupones de GoodRx.  - Si necesita que su receta se enve electrnicamente a una farmacia diferente, informe a nuestra oficina a travs de MyChart de [place] o por telfono llamando al [phone] y presione la opcin 4.

## 2023-09-23 ENCOUNTER — Other Ambulatory Visit: Payer: Self-pay

## 2023-09-24 ENCOUNTER — Other Ambulatory Visit (HOSPITAL_COMMUNITY): Payer: Self-pay

## 2023-09-25 ENCOUNTER — Other Ambulatory Visit: Payer: Self-pay

## 2023-09-25 ENCOUNTER — Encounter (HOSPITAL_COMMUNITY): Payer: Self-pay

## 2023-09-25 NOTE — Progress Notes (Signed)
 Specialty Pharmacy Ongoing Clinical Assessment Note  Tyrone Mccormick is a 63 y.o. male who is being followed by the specialty pharmacy service for RxSp Psoriasis   Patient's specialty medication(s) reviewed today: Apremilast [name])   Missed doses in the last 4 weeks: 0   Patient/Caregiver did not have any additional questions or concerns.   Therapeutic benefit summary: Patient is achieving benefit   Adverse events/side effects summary: No adverse events/side effects   Patient's therapy is appropriate to: Continue    Goals Addressed             This Visit's Progress    Reduce signs and symptoms       Patient is on track. Patient will maintain adherence         Follow up:  6 months  Otto Herb Specialty Pharmacist

## 2023-09-25 NOTE — Progress Notes (Signed)
 Specialty Pharmacy Refill Coordination Note  Tyrone Mccormick is a 63 y.o. male contacted today regarding refills of specialty medication(s) Apremilast [name])   Patient requested Delivery   Delivery date: 10/09/23   Verified address: [address] [place] [address]   Medication will be filled on 10/08/23.

## 2023-10-02 ENCOUNTER — Other Ambulatory Visit: Payer: Self-pay

## 2023-10-08 ENCOUNTER — Other Ambulatory Visit: Payer: Self-pay

## 2023-10-08 NOTE — Progress Notes (Signed)
 Pharmacy Patient Advocate Encounter  Received notification from [facility] that Prior Authorization for Tyrone Mccormick has been APPROVED from 10/08/23 to 10/07/24   PA #/Case ID/Reference #: [id number]

## 2023-10-08 NOTE — Progress Notes (Signed)
 Pharmacy Patient Advocate Encounter   Received notification from Patient Pharmacy that prior authorization for Tyrone Mccormick is required/requested.   Insurance verification completed.   The patient is insured through [facility] .   Per test claim: PA required; PA submitted to above mentioned insurance via CoverMyMeds Key/confirmation #/EOC [id number] Status is pending

## 2023-10-12 ENCOUNTER — Other Ambulatory Visit: Payer: Self-pay

## 2023-10-12 ENCOUNTER — Other Ambulatory Visit (HOSPITAL_COMMUNITY): Payer: Self-pay

## 2023-10-12 NOTE — Progress Notes (Signed)
 Patient called to provide [facility] Copay card for next fill. Added to profile as secondary payer. Patient does not need a refill at this time.

## 2023-11-01 ENCOUNTER — Other Ambulatory Visit: Payer: Self-pay

## 2023-11-05 ENCOUNTER — Other Ambulatory Visit (HOSPITAL_COMMUNITY): Payer: Self-pay

## 2023-11-07 ENCOUNTER — Other Ambulatory Visit (HOSPITAL_COMMUNITY): Payer: Self-pay

## 2023-12-07 ENCOUNTER — Other Ambulatory Visit: Payer: Self-pay

## 2023-12-24 ENCOUNTER — Other Ambulatory Visit: Payer: Self-pay | Admitting: Pharmacy Technician

## 2023-12-24 ENCOUNTER — Other Ambulatory Visit (HOSPITAL_COMMUNITY): Payer: Self-pay

## 2023-12-24 ENCOUNTER — Other Ambulatory Visit: Payer: Self-pay

## 2023-12-24 NOTE — Progress Notes (Signed)
 Specialty Pharmacy Refill Coordination Note  Tyrone Mccormick is a 63 y.o. male contacted today regarding refills of specialty medication(s) Apremilast  (Otezla )   Patient requested Delivery   Delivery date: 12/26/23   Verified address: [address] [place], [place] [address]   Medication will be filled on 12/25/23.

## 2023-12-25 ENCOUNTER — Other Ambulatory Visit (HOSPITAL_COMMUNITY): Payer: Self-pay

## 2023-12-25 ENCOUNTER — Other Ambulatory Visit: Payer: Self-pay

## 2024-01-02 ENCOUNTER — Other Ambulatory Visit: Payer: Self-pay

## 2024-01-21 ENCOUNTER — Other Ambulatory Visit: Payer: Self-pay

## 2024-01-23 ENCOUNTER — Other Ambulatory Visit: Payer: Self-pay

## 2024-01-25 ENCOUNTER — Other Ambulatory Visit: Payer: Self-pay

## 2024-01-25 NOTE — Progress Notes (Signed)
 Specialty Pharmacy Refill Coordination Note  Tyrone Mccormick is a 63 y.o. male contacted today regarding refills of specialty medication(s) Apremilast  (Otezla )   Patient requested Delivery   Delivery date: 02/01/24   Verified address: [address] [place], [place] [address]   Medication will be filled on 01/31/24.

## 2024-02-28 ENCOUNTER — Other Ambulatory Visit: Payer: Self-pay

## 2024-03-03 ENCOUNTER — Other Ambulatory Visit (HOSPITAL_COMMUNITY): Payer: Self-pay

## 2024-03-04 ENCOUNTER — Other Ambulatory Visit: Payer: Self-pay

## 2024-03-04 NOTE — Progress Notes (Signed)
 Specialty Pharmacy Refill Coordination Note  Tyrone Mccormick is a 63 y.o. male contacted today regarding refills of specialty medication(s) Apremilast  (Otezla )   Patient requested Delivery   Delivery date: 03/06/24   Verified address: [address] [place], [place] [address]   Medication will be filled on 03/05/24.

## 2024-03-05 ENCOUNTER — Other Ambulatory Visit: Payer: Self-pay

## 2024-03-17 ENCOUNTER — Other Ambulatory Visit: Payer: Self-pay

## 2024-03-27 ENCOUNTER — Ambulatory Visit: Payer: Commercial Managed Care - PPO | Admitting: Dermatology

## 2024-03-28 ENCOUNTER — Other Ambulatory Visit: Payer: Self-pay

## 2024-03-31 ENCOUNTER — Ambulatory Visit (INDEPENDENT_AMBULATORY_CARE_PROVIDER_SITE_OTHER): Admitting: Dermatology

## 2024-03-31 ENCOUNTER — Other Ambulatory Visit: Payer: Self-pay

## 2024-03-31 DIAGNOSIS — Z79899 Other long term (current) drug therapy: Secondary | ICD-10-CM

## 2024-03-31 DIAGNOSIS — L409 Psoriasis, unspecified: Secondary | ICD-10-CM

## 2024-03-31 DIAGNOSIS — Z7189 Other specified counseling: Secondary | ICD-10-CM

## 2024-03-31 DIAGNOSIS — I781 Nevus, non-neoplastic: Secondary | ICD-10-CM

## 2024-03-31 DIAGNOSIS — L309 Dermatitis, unspecified: Secondary | ICD-10-CM

## 2024-03-31 DIAGNOSIS — R233 Spontaneous ecchymoses: Secondary | ICD-10-CM

## 2024-03-31 DIAGNOSIS — L813 Cafe au lait spots: Secondary | ICD-10-CM

## 2024-03-31 DIAGNOSIS — D234 Other benign neoplasm of skin of scalp and neck: Secondary | ICD-10-CM | POA: Diagnosis not present

## 2024-03-31 MED ORDER — TRIAMCINOLONE ACETONIDE 0.1 % EX CREA
TOPICAL_CREAM | CUTANEOUS | 2 refills | Status: AC
  Filled 2024-03-31: qty 80, 50d supply, fill #0
  Filled 2024-07-10: qty 80, 5d supply, fill #0

## 2024-03-31 MED ORDER — CRISABOROLE 2 % EX OINT
TOPICAL_OINTMENT | CUTANEOUS | 3 refills | Status: AC
  Filled 2024-03-31: qty 100, 50d supply, fill #0
  Filled 2024-07-10: qty 100, 30d supply, fill #0
  Filled 2024-08-21: qty 100, 30d supply, fill #1

## 2024-03-31 NOTE — Progress Notes (Unsigned)
 Follow-Up Visit   Subjective  Tyrone Mccormick is a 63 y.o. male who presents for the following: 6 month psoriasis follow up , patient is doing well on otezla  30 mg and denies side effects such as gi symptoms, headache, or depression, reports no active areas today. History of flares at hands b/l.  The following portions of the chart were reviewed this encounter and updated as appropriate: medications, allergies, medical history  Review of Systems:  No other skin or systemic complaints except as noted in HPI or Assessment and Plan.  Objective  Well appearing patient in no apparent distress; mood and affect are within normal limits.  A focused examination was performed of the following areas: B/l hands  Relevant exam findings are noted in the Assessment and Plan.  Posterior scalp - blue nevus vs trauma         Assessment & Plan   TELANGIECTASIA Exam: dilated blood vessel(s) at right forehead and cheeks  Treatment Plan: Benign appearing on exam Call for changes  Traumatic tattoo (hx of head injury)  vs Blue Nevus at scalp Exam: 0.3 x 0.4 cm gray blue macule at posterior scalp see photos  Treatment Plan: Observed under dermatoscope Discussed Benign appearing  - could be related to trauma  Dr. Claudene observed area under dermatoscope and agrees Benign Favor blue nevus vs trauma  Discussed do not recommended treatment. Not worried unless it changes Photos taken If changes may consider bx  Benign-appearing.  Observation.  Call clinic for new or changing lesions.  Recommend daily use of broad spectrum spf 30+ sunscreen to sun-exposed areas.    Psoriasis  Exam:  peeling on right thumb and patch on right thumb  BSA 1 % Chronic condition with duration or expected duration over one year. Currently well-controlled on Otezla   Counseling on psoriasis and coordination of care  psoriasis is a chronic non-curable, but treatable genetic/hereditary disease that may have other  systemic features affecting other organ systems such as joints (Psoriatic Arthritis). It is associated with an increased risk of inflammatory bowel disease, heart disease, non-alcoholic fatty liver disease, and depression.  Treatments include light and laser treatments; topical medications; and systemic medications including oral and injectables.    No side effects like no weight loss, no headache  depression or GI Issues    Continue Otezla  30 mg every other day, may increase to daily prn flares  Continue Eucrisa  ointment daily prn Continue Triamcinolone  cream prn flares only Patient will call for refills. Consider sending Eucrisa  to [facility] if less expensive.    Long term medication management.  Patient is using long term (months to years) prescription medication  to control their dermatologic condition.  These medications require periodic monitoring to evaluate for efficacy and side effects and may require periodic laboratory monitoring.   Side effects of Otezla  (apremilast ) include diarrhea, nausea, headache, upper respiratory infection, depression, and weight decrease (5-10%). It should only be taken by pregnant women after a discussion regarding risks and benefits with their doctor. Goal is control of skin condition, not cure.  The use of Otezla  requires long term medication management, including periodic office visits.    Topical steroids (such as triamcinolone , fluocinolone, fluocinonide, mometasone, clobetasol, halobetasol, betamethasone, hydrocortisone) can cause thinning and lightening of the skin if they are used for too long in the same area. Your physician has selected the right strength medicine for your problem and area affected on the body. Please use your medication only as directed by your physician  to prevent side effects.  HAND DERMATITIS   Related Medications triamcinolone  cream (KENALOG ) 0.1 % APPLY TO THE AFFECTED AREA(S) ON HANDS AND FEET DAILY AS DIRECTED UP TO 5 DAYS A  WEEK AS NEEDED FOR FLARES. AVOID FACE, GROIN, UNDERARMS PSORIASIS   Related Medications Apremilast  (OTEZLA ) 30 MG TABS Take 1 tablet (30 mg total) by mouth daily. Cris[place]ole  2 % OINT APPLY TO THE AFFECTED AREA(S) TOPICALLY AS DIRECTED ONCE TO TWICE A DAY ON HANDS AND FEET AS NEEDED FOR PSORIASIS FLARES COUNSELING AND COORDINATION OF CARE   MEDICATION MANAGEMENT   LONG-TERM USE OF HIGH-RISK MEDICATION   TELANGIECTASIA    Return in about 6 months (around 10/01/2024) for psoriasis.  IEleanor Blush, CMA, am acting as scribe for Alm Rhyme, MD.   Documentation: I have reviewed the above documentation for accuracy and completeness, and I agree with the above.  Alm Rhyme, MD

## 2024-03-31 NOTE — Patient Instructions (Signed)

## 2024-04-01 ENCOUNTER — Other Ambulatory Visit: Payer: Self-pay

## 2024-04-01 ENCOUNTER — Encounter: Payer: Self-pay | Admitting: Dermatology

## 2024-04-01 NOTE — Progress Notes (Signed)
 Specialty Pharmacy Refill Coordination Note  Tyrone Mccormick is a 63 y.o. male contacted today regarding refills of specialty medication(s) Apremilast  (Otezla )   Patient requested Delivery   Delivery date: 04/10/24   Verified address: [address] [place], [place] [address]   Medication will be filled on 04/09/24.

## 2024-04-02 ENCOUNTER — Other Ambulatory Visit: Payer: Self-pay

## 2024-04-08 ENCOUNTER — Encounter: Payer: Self-pay | Admitting: Sports Medicine

## 2024-04-09 ENCOUNTER — Other Ambulatory Visit: Payer: Self-pay | Admitting: Dermatology

## 2024-04-09 ENCOUNTER — Ambulatory Visit: Attending: Medical-Surgical | Admitting: Pharmacist

## 2024-04-09 ENCOUNTER — Other Ambulatory Visit: Payer: Self-pay | Admitting: Pharmacist

## 2024-04-09 ENCOUNTER — Other Ambulatory Visit: Payer: Self-pay

## 2024-04-09 DIAGNOSIS — L409 Psoriasis, unspecified: Secondary | ICD-10-CM

## 2024-04-09 DIAGNOSIS — Z79899 Other long term (current) drug therapy: Secondary | ICD-10-CM

## 2024-04-09 MED ORDER — OTEZLA 30 MG PO TABS
30.0000 mg | ORAL_TABLET | Freq: Every day | ORAL | 6 refills | Status: DC
Start: 2024-04-09 — End: 2024-04-09
  Filled 2024-04-09: qty 30, 30d supply, fill #0

## 2024-04-09 MED ORDER — OTEZLA 30 MG PO TABS
30.0000 mg | ORAL_TABLET | Freq: Every day | ORAL | 6 refills | Status: AC
  Filled 2024-04-09: qty 30, 30d supply, fill #0
  Filled 2024-05-06 – 2024-06-09 (×3): qty 30, 30d supply, fill #1
  Filled 2024-07-15 – 2024-08-14 (×3): qty 30, 30d supply, fill #2

## 2024-04-09 NOTE — Progress Notes (Signed)
 See OV from 04/09/24 for complete documentation.   Herlene Fleeta Morris, PharmD, JAQUELINE, CPP Clinical Pharmacist [facility] [phone]

## 2024-04-09 NOTE — Progress Notes (Signed)
  S: Patient presents for review of their specialty medication therapy.  Patient is currently taking Otezla  for atopic dermatitis. Patient is managed by Dr. Hester for this.   Adherence: adherence reported  Efficacy: reports that it controls things well  Dosing:  Active psoriatic arthritis or plaque psoriasis (moderate to severe): Oral: Initial: 10 mg in the morning. Titrate upward by additional 10 mg per day on days 2 to 5 as follows: Day 2: 10 mg twice daily; Day 3: 10 mg in the morning and 20 mg in the evening; Day 4: 20 mg twice daily; Day 5: 20 mg in the morning and 30 mg in the evening. Maintenance dose: 30 mg twice daily starting on day 6  CrCl <30 mL/minute: Initial: 10 mg in the morning on days 1 to 3; titrate using morning doses only (skip evening doses) to 20 mg on days 4 and 5. Maintenance dose: 30 mg once daily in the morning starting on day 6.    Current adverse effects: Headache: none  GI upset: none  Weight loss: none  Neuropsychiatric effects: none   O:  Lab Results  Component Value Date   WBC 5.6 04/11/2023   HGB 16.7 04/11/2023   HCT 50.2 04/11/2023   MCV 94 04/11/2023   PLT 288 04/11/2023      Chemistry      Component Value Date/Time   NA 140 04/11/2023 0907   K 4.5 04/11/2023 0907   CL 103 04/11/2023 0907   CO2 24 04/11/2023 0907   BUN 15 04/11/2023 0907   CREATININE 0.91 04/11/2023 0907      Component Value Date/Time   CALCIUM  9.1 04/11/2023 0907   ALKPHOS 80 04/11/2023 0907   AST 22 04/11/2023 0907   ALT 23 04/11/2023 0907   BILITOT 1.4 (H) 04/11/2023 0907       A/P: 1. Medication review: patient is currently on Otezla  for atopic dermatitis and is tolerating well. Reviewed the medication including the following: apremilast  inhibits phosphodiesterase 4 (PDE4) specific for cyclic adenosine monophosphate (cAMP) which results in increased intracellular cAMP levels and regulation of numerous inflammatory mediators (eg, decreased expression of  nitric oxide synthase, TNF-alpha, and interleukin [IL]-23, as well as increased IL-10. Patient educated on purpose, proper use and potential adverse effects of Otezla . Possible adverse effects include weight loss, GI upset, headache, and mood changes. Renal function should be routinely monitored. Administer without regard to food. Do not crush, chew, or split tablets. No recommendations for any changes at this time.   Herlene Fleeta Morris, PharmD, JAQUELINE, CPP Clinical Pharmacist [facility] [phone]

## 2024-04-10 ENCOUNTER — Other Ambulatory Visit: Payer: Self-pay

## 2024-04-11 ENCOUNTER — Other Ambulatory Visit: Payer: Self-pay

## 2024-04-11 ENCOUNTER — Encounter: Payer: Self-pay | Admitting: Medical-Surgical

## 2024-04-11 ENCOUNTER — Ambulatory Visit (INDEPENDENT_AMBULATORY_CARE_PROVIDER_SITE_OTHER): Payer: 59 | Admitting: Medical-Surgical

## 2024-04-11 VITALS — BP 102/63 | HR 64 | Resp 20 | Ht 70.0 in | Wt 177.0 lb

## 2024-04-11 DIAGNOSIS — N529 Male erectile dysfunction, unspecified: Secondary | ICD-10-CM

## 2024-04-11 DIAGNOSIS — Z0000 Encounter for general adult medical examination without abnormal findings: Secondary | ICD-10-CM

## 2024-04-11 DIAGNOSIS — R972 Elevated prostate specific antigen [PSA]: Secondary | ICD-10-CM | POA: Diagnosis not present

## 2024-04-11 DIAGNOSIS — E7800 Pure hypercholesterolemia, unspecified: Secondary | ICD-10-CM | POA: Diagnosis not present

## 2024-04-11 DIAGNOSIS — Z23 Encounter for immunization: Secondary | ICD-10-CM

## 2024-04-11 MED ORDER — SILDENAFIL CITRATE 20 MG PO TABS
20.0000 mg | ORAL_TABLET | Freq: Every day | ORAL | 3 refills | Status: AC | PRN
Start: 2024-04-11 — End: ?
  Filled 2024-04-11: qty 40, 20d supply, fill #0
  Filled 2024-06-26: qty 40, 8d supply, fill #0

## 2024-04-11 MED ORDER — ATORVASTATIN CALCIUM 20 MG PO TABS
20.0000 mg | ORAL_TABLET | Freq: Every day | ORAL | 3 refills | Status: AC
Start: 2024-04-11 — End: ?
  Filled 2024-04-11 – 2024-06-26 (×2): qty 90, 90d supply, fill #0

## 2024-04-11 NOTE — Patient Instructions (Signed)

## 2024-04-11 NOTE — Progress Notes (Signed)
 Complete physical exam  Patient: Tyrone Mccormick   DOB: Nov 02, 1960   62 y.o. Male  MRN: [id number]  Subjective:    Chief Complaint  Patient presents with   Annual Exam    PHILEMON RIEDESEL is a 63 y.o. male who presents today for a complete physical exam. He reports consuming a general diet. Participates in daily exercise with various activities. He generally feels well. He reports sleeping well. He does not have additional problems to discuss today.    Most recent fall risk assessment:    04/11/2023    8:37 AM  Fall Risk   Falls in the past year? 0  Number falls in past yr: 0  Injury with Fall? 0  Risk for fall due to : No Fall Risks  Follow up Falls evaluation completed     Most recent depression screenings:    04/11/2024    8:29 AM 04/11/2023    8:37 AM  PHQ 2/9 Scores  PHQ - 2 Score 0 0    Vision:Within last year, Dental: No current dental problems and Receives regular dental care, and PSA: Declines PSA testing at this time     Patient Care Team: Willo Mini, NP as PCP - General (Nurse Practitioner)   Outpatient Medications Prior to Visit  Medication Sig   Apremilast  (OTEZLA ) 30 MG TABS Take 1 tablet (30 mg total) by mouth daily.   aspirin 81 MG tablet Take 81 mg by mouth daily. Reported on 08/18/2015   atorvastatin  (LIPITOR) 20 MG tablet Take 1 tablet (20 mg total) by mouth daily.   cetirizine (ZYRTEC) 10 MG tablet Take 10 mg by mouth daily. Reported on 08/18/2015   Crisaborole  2 % OINT APPLY TO THE AFFECTED AREA(S) TOPICALLY AS DIRECTED ONCE TO TWICE A DAY ON HANDS AND FEET AS NEEDED FOR PSORIASIS FLARES   MELATONIN PO Take by mouth daily.   Multiple Vitamin (MULTIVITAMIN) tablet Take 1 tablet by mouth daily.   sildenafil  (REVATIO ) 20 MG tablet Take 1-5 tablets (20-100 mg total) by mouth daily as needed.   triamcinolone  cream (KENALOG ) 0.1 % APPLY TO THE AFFECTED AREA(S) ON HANDS AND FEET DAILY AS DIRECTED UP TO 5 DAYS A WEEK AS NEEDED FOR FLARES. AVOID FACE, GROIN,  UNDERARMS   [DISCONTINUED] azelastine (ASTELIN) 0.1 % nasal spray Place into the nose.   No facility-administered medications prior to visit.   Review of Systems  Constitutional:  Negative for chills, fever, malaise/fatigue and weight loss.  HENT:  Negative for congestion, ear pain, hearing loss, sinus pain and sore throat.   Eyes:  Negative for blurred vision, photophobia and pain.  Respiratory:  Negative for cough, shortness of breath and wheezing.   Cardiovascular:  Negative for chest pain, palpitations and leg swelling.  Gastrointestinal:  Negative for abdominal pain, constipation, diarrhea, heartburn, nausea and vomiting.  Genitourinary:  Negative for dysuria, frequency and urgency.  Musculoskeletal:  Negative for falls and neck pain.  Skin:  Negative for itching and rash.  Neurological:  Negative for dizziness, weakness and headaches.  Endo/Heme/Allergies:  Negative for polydipsia. Does not bruise/bleed easily.  Psychiatric/Behavioral:  Negative for depression, substance abuse and suicidal ideas. The patient is not nervous/anxious.      Objective:     BP 102/63 (BP Location: Right Arm, Cuff Size: Normal)   Pulse 64   Resp 20   Ht 5' 10 (1.778 m)   Wt 177 lb (80.3 kg)   SpO2 96%   BMI 25.40 kg/m  Physical Exam Vitals reviewed.  Constitutional:      General: He is not in acute distress.    Appearance: Normal appearance. He is not ill-appearing.  HENT:     Head: Normocephalic and atraumatic.     Right Ear: Tympanic membrane, ear canal and external ear normal. There is no impacted cerumen.     Left Ear: Tympanic membrane, ear canal and external ear normal. There is no impacted cerumen.     Nose: Nose normal. No congestion or rhinorrhea.     Mouth/Throat:     Mouth: Mucous membranes are moist.     Pharynx: No oropharyngeal exudate or posterior oropharyngeal erythema.  Eyes:     General: No scleral icterus.       Right eye: No discharge.        Left eye: No  discharge.     Extraocular Movements: Extraocular movements intact.     Conjunctiva/sclera: Conjunctivae normal.     Pupils: Pupils are equal, round, and reactive to light.  Neck:     Thyroid: No thyromegaly.     Vascular: No carotid bruit or JVD.     Trachea: Trachea normal.  Cardiovascular:     Rate and Rhythm: Normal rate and regular rhythm.     Pulses: Normal pulses.     Heart sounds: Normal heart sounds. No murmur heard.    No friction rub. No gallop.  Pulmonary:     Effort: Pulmonary effort is normal. No respiratory distress.     Breath sounds: Normal breath sounds. No wheezing.  Abdominal:     General: Bowel sounds are normal. There is no distension.     Palpations: Abdomen is soft.     Tenderness: There is no abdominal tenderness. There is no guarding.  Musculoskeletal:        General: Normal range of motion.     Cervical back: Normal range of motion and neck supple.  Lymphadenopathy:     Cervical: No cervical adenopathy.  Skin:    General: Skin is warm and dry.  Neurological:     Mental Status: He is alert and oriented to person, place, and time.     Cranial Nerves: No cranial nerve deficit.  Psychiatric:        Mood and Affect: Mood normal.        Behavior: Behavior normal.        Thought Content: Thought content normal.        Judgment: Judgment normal.   No results found for any visits on 04/11/24.     Assessment & Plan:    Routine Health Maintenance and Physical Exam  Immunization History  Administered Date(s) Administered   DT (Pediatric) 03/20/2008   Influenza-Unspecified 04/16/2015, 04/18/2018, 04/24/2022   PFIZER(Purple Top)SARS-COV-2 Vaccination 07/08/2019, 08/08/2019   Tdap 07/09/2018   Zoster Recombinant(Shingrix) 09/19/2019, 02/20/2020    Health Maintenance  Topic Date Due   Pneumococcal Vaccine: 50+ Years (1 of 1 - PCV) Never done   COVID-19 Vaccine (3 - Pfizer risk series) 04/27/2024 (Originally 09/05/2019)   Influenza Vaccine  11/04/2024  (Originally 03/07/2024)   DTaP/Tdap/Td (3 - Td or Tdap) 07/09/2028   Colonoscopy  02/13/2033   Zoster Vaccines- Shingrix  Completed   Hepatitis B Vaccines 19-59 Average Risk  Aged Out   HPV VACCINES  Aged Out   Meningococcal B Vaccine  Aged Out   Hepatitis C Screening  Discontinued   HIV Screening  Discontinued    Discussed health benefits of physical activity, and encouraged  him to engage in regular exercise appropriate for his age and condition.  1. Annual physical exam (Primary) Checking labs as below. UTD on preventative care. Wellness information provided with AVS. - Lipid panel - CBC with Differential/Platelet - CMP14+EGFR  2. Hypercholesterolemia Checking lipids. Continue atorvastatin  20mg  daily.  - Lipid panel - atorvastatin  (LIPITOR) 20 MG tablet; Take 1 tablet (20 mg total) by mouth daily.  Dispense: 90 tablet; Refill: 3  3. Elevated PSA History of elevated PSA. Discussed rechecking, declined today.   4. Need for pneumococcal 20-valent conjugate vaccination Prevnar 20 given in office today. - Pneumococcal conjugate vaccine 20-valent (Prevnar 20)  5. ED (erectile dysfunction) of organic origin Continue sildenafil  prn.  - sildenafil  (REVATIO ) 20 MG tablet; Take 1-5 tablets (20-100 mg total) by mouth daily as needed.  Dispense: 40 tablet; Refill: 3   Return in about 1 year (around 04/11/2025) for annual physical exam or sooner if needed.     Jonne Rote, NP

## 2024-04-12 LAB — CMP14+EGFR
ALT: 21 IU/L (ref 0–44)
AST: 19 IU/L (ref 0–40)
Albumin: 4.3 g/dL (ref 3.9–4.9)
Alkaline Phosphatase: 73 IU/L (ref 44–121)
BUN/Creatinine Ratio: 21 (ref 10–24)
BUN: 17 mg/dL (ref 8–27)
Bilirubin Total: 1.1 mg/dL (ref 0.0–1.2)
CO2: 19 mmol/L — ABNORMAL LOW (ref 20–29)
Calcium: 9.2 mg/dL (ref 8.6–10.2)
Chloride: 104 mmol/L (ref 96–106)
Creatinine, Ser: 0.82 mg/dL (ref 0.76–1.27)
Globulin, Total: 1.4 g/dL — ABNORMAL LOW (ref 1.5–4.5)
Glucose: 93 mg/dL (ref 70–99)
Potassium: 4.8 mmol/L (ref 3.5–5.2)
Sodium: 140 mmol/L (ref 134–144)
Total Protein: 5.7 g/dL — ABNORMAL LOW (ref 6.0–8.5)
eGFR: 100 mL/min/1.73 (ref >59)

## 2024-04-12 LAB — CBC WITH DIFFERENTIAL/PLATELET
Basophils Absolute: 0.1 x10E3/uL (ref 0.0–0.2)
Basos: 1 %
EOS (ABSOLUTE): 0.1 x10E3/uL (ref 0.0–0.4)
Eos: 2 %
Hematocrit: 46.2 % (ref 37.5–51.0)
Hemoglobin: 15 g/dL (ref 13.0–17.7)
Immature Grans (Abs): 0 x10E3/uL (ref 0.0–0.1)
Immature Granulocytes: 0 %
Lymphocytes Absolute: 1 x10E3/uL (ref 0.7–3.1)
Lymphs: 19 %
MCH: 30.4 pg (ref 26.6–33.0)
MCHC: 32.5 g/dL (ref 31.5–35.7)
MCV: 94 fL (ref 79–97)
Monocytes Absolute: 0.5 x10E3/uL (ref 0.1–0.9)
Monocytes: 9 %
Neutrophils Absolute: 3.6 x10E3/uL (ref 1.4–7.0)
Neutrophils: 69 %
Platelets: 280 x10E3/uL (ref 150–450)
RBC: 4.93 x10E6/uL (ref 4.14–5.80)
RDW: 12.5 % (ref 11.6–15.4)
WBC: 5.3 x10E3/uL (ref 3.4–10.8)

## 2024-04-12 LAB — LIPID PANEL
Chol/HDL Ratio: 3.9 ratio (ref 0.0–5.0)
Cholesterol, Total: 176 mg/dL (ref 100–199)
HDL: 45 mg/dL (ref >39)
LDL Chol Calc (NIH): 116 mg/dL — ABNORMAL HIGH (ref 0–99)
Triglycerides: 78 mg/dL (ref 0–149)
VLDL Cholesterol Cal: 15 mg/dL (ref 5–40)

## 2024-04-13 ENCOUNTER — Ambulatory Visit: Payer: Self-pay | Admitting: Medical-Surgical

## 2024-04-18 ENCOUNTER — Telehealth: Payer: Self-pay

## 2024-04-18 NOTE — Telephone Encounter (Signed)
 Patient is returning call, transferred to clinic.

## 2024-04-18 NOTE — Telephone Encounter (Signed)
 Patient informed and will increase protein in diet.  He states he has been intentionally avoiding red meats and only eating chicken and fish sparingly getting ready for the cholesterol check. He will try to incorporate more protein in his diet.

## 2024-04-18 NOTE — Telephone Encounter (Signed)
 Copied from CRM [id number]. Topic: Clinical - Lab/Test Results >> Apr 18, 2024  8:28 AM Farrel B wrote: Reason for CRM: [id number] (M) called Mr. Cominsky called to speak with Ms. Theophilus in regard to  his labs, spoke with Taijay in [place] and was informed Ms. Theophilus was in with a patient at the moment but would call back. Patient stated he was headed in to the room with a patient to have Ms. Theophilus leave a message for him

## 2024-04-18 NOTE — Telephone Encounter (Signed)
 Willo Mini, NP to FINNLEY LARUSSO     04/13/24  2:49 PM Hi Christopher, Your labs came back looking good with a couple of abnormal findings. Your LDL cholesterol has jumped up a bit and could use some work. Make sure to keep working on a low fat, heart healthy diet with regular intentional exercise to help get this back under control. Your protein levels are a bit lower than recommended so work on boosting your intake of lean proteins on a daily basis.  Thanks, Joy    attempted call to patient. As vanicia will be out of the office this afternoon. Left a voice mail message requesting a return call.

## 2024-04-21 ENCOUNTER — Other Ambulatory Visit: Payer: Self-pay

## 2024-05-06 ENCOUNTER — Other Ambulatory Visit: Payer: Self-pay

## 2024-05-13 ENCOUNTER — Other Ambulatory Visit: Payer: Self-pay

## 2024-06-05 ENCOUNTER — Other Ambulatory Visit: Payer: Self-pay

## 2024-06-09 ENCOUNTER — Other Ambulatory Visit: Payer: Self-pay

## 2024-06-09 ENCOUNTER — Other Ambulatory Visit (HOSPITAL_COMMUNITY): Payer: Self-pay

## 2024-06-09 NOTE — Progress Notes (Signed)
 Specialty Pharmacy Refill Coordination Note  Tyrone Mccormick is a 63 y.o. male contacted today regarding refills of specialty medication(s) Apremilast  (Otezla )   Patient requested Delivery   Delivery date: 06/18/24   Verified address: [address] [place], [place] [address]   Medication will be filled on: 06/17/24

## 2024-06-17 ENCOUNTER — Other Ambulatory Visit: Payer: Self-pay

## 2024-06-26 ENCOUNTER — Other Ambulatory Visit: Payer: Self-pay

## 2024-06-30 ENCOUNTER — Other Ambulatory Visit (HOSPITAL_COMMUNITY): Payer: Self-pay

## 2024-07-10 ENCOUNTER — Other Ambulatory Visit: Payer: Self-pay

## 2024-07-15 ENCOUNTER — Other Ambulatory Visit: Payer: Self-pay

## 2024-07-18 ENCOUNTER — Other Ambulatory Visit: Payer: Self-pay

## 2024-07-18 ENCOUNTER — Other Ambulatory Visit (HOSPITAL_COMMUNITY): Payer: Self-pay

## 2024-07-24 ENCOUNTER — Encounter: Payer: Self-pay | Admitting: Dermatology

## 2024-07-24 ENCOUNTER — Ambulatory Visit: Admitting: Dermatology

## 2024-07-24 ENCOUNTER — Other Ambulatory Visit (HOSPITAL_COMMUNITY): Payer: Self-pay

## 2024-07-24 ENCOUNTER — Other Ambulatory Visit: Payer: Self-pay

## 2024-07-24 DIAGNOSIS — B358 Other dermatophytoses: Secondary | ICD-10-CM | POA: Diagnosis not present

## 2024-07-24 DIAGNOSIS — B351 Tinea unguium: Secondary | ICD-10-CM

## 2024-07-24 DIAGNOSIS — B354 Tinea corporis: Secondary | ICD-10-CM

## 2024-07-24 DIAGNOSIS — L409 Psoriasis, unspecified: Secondary | ICD-10-CM

## 2024-07-24 DIAGNOSIS — Z79899 Other long term (current) drug therapy: Secondary | ICD-10-CM

## 2024-07-24 DIAGNOSIS — B353 Tinea pedis: Secondary | ICD-10-CM | POA: Diagnosis not present

## 2024-07-24 DIAGNOSIS — Z7189 Other specified counseling: Secondary | ICD-10-CM

## 2024-07-24 DIAGNOSIS — B356 Tinea cruris: Secondary | ICD-10-CM | POA: Diagnosis not present

## 2024-07-24 MED ORDER — TERBINAFINE HCL 250 MG PO TABS
250.0000 mg | ORAL_TABLET | Freq: Every day | ORAL | 0 refills | Status: DC
  Filled 2024-07-24 (×4): qty 28, 28d supply, fill #0

## 2024-07-24 MED ORDER — KETOCONAZOLE 2 % EX CREA
TOPICAL_CREAM | CUTANEOUS | 1 refills | Status: DC
  Filled 2024-07-24 (×2): qty 30, 30d supply, fill #0
  Filled 2024-07-24: qty 30, 60d supply, fill #0
  Filled 2024-07-24: qty 15, 30d supply, fill #0
  Filled 2024-07-28: qty 30, 30d supply, fill #1

## 2024-07-24 NOTE — Patient Instructions (Signed)

## 2024-07-24 NOTE — Progress Notes (Signed)
 Follow-Up Visit   Subjective  Tyrone Mccormick is a 63 y.o. male who presents for the following: patient here concerning a rash that happened about a week and 8 to 9 days ago started at his buttocks and spread to groin. He also has active flare at his left wrist.  Patient has history of psoriasis at hands and wrist. Has been using Eucrisa  ointment and Tmc 0.1 % cream to affected rash but no response.    The following portions of the chart were reviewed this encounter and updated as appropriate: medications, allergies, medical history  Review of Systems:  No other skin or systemic complaints except as noted in HPI or Assessment and Plan.  Objective  Well appearing patient in no apparent distress; mood and affect are within normal limits.   A focused examination was performed of the following areas: Buttocks, groin, hands and arms   Relevant exam findings are noted in the Assessment and Plan.  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 See photos Thickening of toenails  and scaly feet observed at exam today  KOH microscopic exam from buttocks scale negative today.  Treatment Plan:  Dr. Claudene observed active areas and agrees with my diagnosis   Normal Liver Function Test - 04/11/2024  Start Lamisil  250 mg tab - take 1 po qd for 4 weeks   Start Ketoconazole  2% cream apply topically to groin and buttocks nightly for 4 weeks.   Patient advised if not improving after the 1st week of treatment to please give us  a call or send mychart.   Will recheck rash otherwise in 4 weeks.   Psoriasis  Exam:  peeling on right thumb and patch on right thumb  BSA 1 % Chronic condition with duration or expected duration over one year. Currently  well-controlled on Otezla   Counseling on psoriasis and coordination of care  psoriasis is a chronic non-curable, but treatable genetic/hereditary disease that may have other systemic features affecting other organ systems such as joints (Psoriatic Arthritis). It is associated with an increased risk of inflammatory bowel disease, heart disease, non-alcoholic fatty liver disease, and depression.  Treatments include light and laser treatments; topical medications; and systemic medications including oral and injectables.    No side effects like no weight loss, no headache  depression or GI Issues    Continue Otezla  30 mg every other day, may increase to daily prn flares  Continue Eucrisa  ointment daily prn Continue Triamcinolone  cream prn flares only Patient will call for refills. Consider sending Eucrisa  to [facility] if less expensive.    Long term medication management.  Patient is using long term (months to years) prescription medication  to control their dermatologic condition.  These medications require periodic monitoring to evaluate for efficacy and side effects and may require periodic laboratory monitoring.   Side effects of Otezla  (apremilast ) include diarrhea, nausea, headache, upper respiratory infection, depression, and weight decrease (5-10%). It should only be taken by pregnant women after a discussion regarding risks and benefits with their doctor. Goal is control of skin condition, not cure.  The use of Otezla  requires long term medication management, including periodic office visits.    Topical steroids (such as triamcinolone , fluocinolone, fluocinonide, mometasone,  clobetasol, halobetasol, betamethasone, hydrocortisone) can cause thinning and lightening of the skin if they are used for too long in the same area. Your physician has selected the right strength medicine for your problem and area affected on the body. Please use your medication only as directed by your physician to prevent  side effects.  TINEA CRURIS   This Visit - terbinafine  (LAMISIL ) 250 MG tablet - Take 1 tablet (250 mg total) by mouth daily. For tinea corporis and - ketoconazole  (NIZORAL ) 2 % cream - Apply topically to groin and buttocks for rash daily until clear TINEA CORPORIS   This Visit - terbinafine  (LAMISIL ) 250 MG tablet - Take 1 tablet (250 mg total) by mouth daily. For tinea corporis and - ketoconazole  (NIZORAL ) 2 % cream - Apply topically to groin and buttocks for rash daily until clear  Return for 4 week follow up on rash .  IEleanor Blush, CMA, am acting as scribe for Alm Rhyme, MD.   Documentation: I have reviewed the above documentation for accuracy and completeness, and I agree with the above.  Alm Rhyme, MD

## 2024-07-29 ENCOUNTER — Other Ambulatory Visit (HOSPITAL_COMMUNITY): Payer: Self-pay

## 2024-07-29 ENCOUNTER — Other Ambulatory Visit: Payer: Self-pay

## 2024-07-29 DIAGNOSIS — B354 Tinea corporis: Secondary | ICD-10-CM

## 2024-07-29 DIAGNOSIS — B356 Tinea cruris: Secondary | ICD-10-CM

## 2024-07-29 MED ORDER — KETOCONAZOLE 2 % EX CREA
TOPICAL_CREAM | CUTANEOUS | 1 refills | Status: AC
  Filled 2024-07-29: qty 30, 7d supply, fill #0
  Filled 2024-07-29: qty 30, 30d supply, fill #0
  Filled 2024-07-29: qty 30, 7d supply, fill #0
  Filled 2024-07-29: qty 30, 30d supply, fill #0
  Filled 2024-07-30: qty 30, 7d supply, fill #0
  Filled 2024-07-30: qty 30, 30d supply, fill #0
  Filled 2024-08-06: qty 30, 7d supply, fill #1

## 2024-07-30 ENCOUNTER — Other Ambulatory Visit (HOSPITAL_COMMUNITY): Payer: Self-pay

## 2024-07-30 ENCOUNTER — Other Ambulatory Visit: Payer: Self-pay

## 2024-08-06 ENCOUNTER — Other Ambulatory Visit: Payer: Self-pay

## 2024-08-08 ENCOUNTER — Other Ambulatory Visit: Payer: Self-pay

## 2024-08-12 ENCOUNTER — Other Ambulatory Visit (HOSPITAL_COMMUNITY): Payer: Self-pay

## 2024-08-13 ENCOUNTER — Other Ambulatory Visit: Payer: Self-pay

## 2024-08-13 ENCOUNTER — Encounter: Payer: Self-pay | Admitting: Dermatology

## 2024-08-13 DIAGNOSIS — B356 Tinea cruris: Secondary | ICD-10-CM

## 2024-08-13 DIAGNOSIS — B354 Tinea corporis: Secondary | ICD-10-CM

## 2024-08-13 MED ORDER — KETOCONAZOLE 2 % EX CREA
TOPICAL_CREAM | CUTANEOUS | 1 refills | Status: DC
  Filled 2024-08-13: qty 30, fill #0

## 2024-08-13 MED ORDER — KETOCONAZOLE 2 % EX CREA
TOPICAL_CREAM | CUTANEOUS | 0 refills | Status: AC
  Filled 2024-08-13: qty 30, 15d supply, fill #0

## 2024-08-14 ENCOUNTER — Other Ambulatory Visit: Payer: Self-pay

## 2024-08-14 NOTE — Progress Notes (Signed)
 Specialty Pharmacy Refill Coordination Note  Tyrone Mccormick is a 64 y.o. male contacted today regarding refills of specialty medication(s) Apremilast  (Otezla )   Patient requested Delivery   Delivery date: 08/18/24   Verified address: [address] [place], [place] [address]   Medication will be filled on: 08/15/24

## 2024-08-15 ENCOUNTER — Other Ambulatory Visit: Payer: Self-pay

## 2024-08-20 ENCOUNTER — Other Ambulatory Visit: Payer: Self-pay

## 2024-08-20 ENCOUNTER — Ambulatory Visit: Admitting: Dermatology

## 2024-08-20 ENCOUNTER — Encounter: Payer: Self-pay | Admitting: Dermatology

## 2024-08-20 DIAGNOSIS — R21 Rash and other nonspecific skin eruption: Secondary | ICD-10-CM | POA: Diagnosis not present

## 2024-08-20 DIAGNOSIS — B353 Tinea pedis: Secondary | ICD-10-CM

## 2024-08-20 DIAGNOSIS — Z79899 Other long term (current) drug therapy: Secondary | ICD-10-CM

## 2024-08-20 DIAGNOSIS — B354 Tinea corporis: Secondary | ICD-10-CM | POA: Diagnosis not present

## 2024-08-20 DIAGNOSIS — B356 Tinea cruris: Secondary | ICD-10-CM

## 2024-08-20 DIAGNOSIS — Z7189 Other specified counseling: Secondary | ICD-10-CM

## 2024-08-20 DIAGNOSIS — B351 Tinea unguium: Secondary | ICD-10-CM | POA: Diagnosis not present

## 2024-08-20 MED ORDER — TERBINAFINE HCL 250 MG PO TABS
250.0000 mg | ORAL_TABLET | Freq: Every day | ORAL | 1 refills | Status: AC
  Filled 2024-08-20: qty 30, 30d supply, fill #0

## 2024-08-20 NOTE — Patient Instructions (Signed)
 Continue Lamisil  250 mg tab as directed, take for additional 2 months.    Terbinafine  Counseling  Terbinafine  is an anti-fungal medicine that can be applied to the skin (over the counter) or taken by mouth (prescription) to treat fungal infections. The pill version is often used to treat fungal infections of the nails or scalp. While most people do not have any side effects from taking terbinafine  pills, some possible side effects of the medicine can include taste changes, headache, loss of smell, vision changes, nausea, vomiting, or diarrhea.   Rare side effects can include irritation of the liver, allergic reaction, or decrease in blood counts (which may show up as not feeling well or developing an infection). If you are concerned about any of these side effects, please stop the medicine and call your doctor, or in the case of an emergency such as feeling very unwell, seek immediate medical care.      Due to recent changes in healthcare laws, you may see results of your pathology and/or laboratory studies on MyChart before the doctors have had a chance to review them. We understand that in some cases there may be results that are confusing or concerning to you. Please understand that not all results are received at the same time and often the doctors may need to interpret multiple results in order to provide you with the best plan of care or course of treatment. Therefore, we ask that you please give us  2 business days to thoroughly review all your results before contacting the office for clarification. Should we see a critical lab result, you will be contacted sooner.   If You Need Anything After Your Visit  If you have any questions or concerns for your doctor, please call our main line at [phone] and press option 4 to reach your doctor's medical assistant. If no one answers, please leave a voicemail as directed and we will return your call as soon as possible. Messages left after 4 pm will  be answered the following business day.   You may also send us  a message via MyChart. We typically respond to MyChart messages within 1-2 business days.  For prescription refills, please ask your pharmacy to contact our office. Our fax number is [phone].  If you have an urgent issue when the clinic is closed that cannot wait until the next business day, you can page your doctor at the number below.    Please note that while we do our best to be available for urgent issues outside of office hours, we are not available 24/7.   If you have an urgent issue and are unable to reach us , you may choose to seek medical care at your doctor's office, retail clinic, urgent care center, or emergency room.  If you have a medical emergency, please immediately call 911 or go to the emergency department.  Pager Numbers  - Dr. Hester: [phone]  - Dr. Jackquline: [phone]  - Dr. Claudene: [phone]   - Dr. Raymund: [phone]  In the event of inclement weather, please call our main line at [phone] for an update on the status of any delays or closures.  Dermatology Medication Tips: Please keep the boxes that topical medications come in in order to help keep track of the instructions about where and how to use these. Pharmacies typically print the medication instructions only on the boxes and not directly on the medication tubes.   If your medication is too expensive, please contact our office at [phone]  option 4 or send us  a message through MyChart.   We are unable to tell what your co-pay for medications will be in advance as this is different depending on your insurance coverage. However, we may be able to find a substitute medication at lower cost or fill out paperwork to get insurance to cover a needed medication.   If a prior authorization is required to get your medication covered by your insurance company, please allow us  1-2 business days to complete this process.  Drug  prices often vary depending on where the prescription is filled and some pharmacies may offer cheaper prices.  The website www.goodrx.com contains coupons for medications through different pharmacies. The prices here do not account for what the cost may be with help from insurance (it may be cheaper with your insurance), but the website can give you the price if you did not use any insurance.  - You can print the associated coupon and take it with your prescription to the pharmacy.  - You may also stop by our office during regular business hours and pick up a GoodRx coupon card.  - If you need your prescription sent electronically to a different pharmacy, notify our office through [facility] or by phone at [phone] option 4.     Si Usted Necesita Algo Despus de Su Visita  Tambin puede enviarnos un mensaje a travs de Clinical Cytogeneticist. Por lo general respondemos a los mensajes de MyChart en el transcurso de 1 a 2 das hbiles.  Para renovar recetas, por favor pida a su farmacia que se ponga en contacto con nuestra oficina. Randi lakes de fax es [place] [phone].  Si tiene un asunto urgente cuando la clnica est cerrada y que no puede esperar hasta el siguiente da hbil, puede llamar/localizar a su doctor(a) al nmero que aparece a continuacin.   Por favor, tenga en cuenta que aunque hacemos todo lo posible para estar disponibles para asuntos urgentes fuera del horario de [place], no estamos disponibles las 24 horas del da, los 7 [address]  [address] de la [place].   Si tiene un problema urgente y no puede comunicarse con nosotros, puede optar por buscar atencin mdica  en el consultorio de su doctor(a), en una clnica privada, en un centro de atencin urgente o en una sala de emergencias.  Si tiene engineer, drilling, por favor llame inmediatamente al 911 o vaya a la sala de emergencias.  Nmeros de bper  - Dr. Hester: [phone]  - Dra. Jackquline: [phone]  - Dr. Claudene:  [phone]  - Dra. Kitts: [phone]  En caso de inclemencias del [place], por favor llame a nuestra lnea principal al [phone] para una actualizacin sobre el estado de cualquier retraso o cierre.  Consejos para la medicacin en dermatologa: Por favor, guarde las cajas en las que vienen los medicamentos de uso tpico para ayudarle a seguir las instrucciones sobre dnde y cmo usarlos. Las farmacias generalmente imprimen las instrucciones del medicamento slo en las cajas y no directamente en los tubos del [place].   Si su medicamento es muy caro, por favor, pngase en contacto con landry rieger llamando al [phone] y presione la opcin 4 o envenos un mensaje a travs de Clinical Cytogeneticist.   No podemos decirle cul ser su copago por los medicamentos por adelantado ya que esto es diferente dependiendo de la cobertura de su seguro. Sin embargo, es posible que podamos encontrar un medicamento sustituto a audiological scientist un formulario para que el seguro [place]  medicamento que se considera necesario.   Si se requiere una autorizacin previa para que su compaa de seguros cubra su medicamento, por favor permtanos de 1 a 2 das hbiles para completar este proceso.  Los precios de los medicamentos varan con frecuencia dependiendo del environmental consultant de dnde se surte la receta y alguna farmacias pueden ofrecer precios ms baratos.  El sitio web www.goodrx.com tiene cupones para medicamentos de health and safety inspector. Los precios aqu no tienen en cuenta lo que podra costar con la ayuda del seguro (puede ser ms barato con su seguro), pero el sitio web puede darle el precio si no utiliz tourist information centre manager.  - Puede imprimir el cupn correspondiente y llevarlo con su receta a la farmacia.  - Tambin puede pasar por nuestra oficina durante el horario de atencin regular y education officer, museum una tarjeta de cupones de GoodRx.  - Si necesita que su receta se enve electrnicamente a una farmacia diferente, informe  a nuestra oficina a travs de MyChart de [place] o por telfono llamando al [phone] y presione la opcin 4.

## 2024-08-20 NOTE — Progress Notes (Signed)
 "  Follow-Up Visit   Subjective  Tyrone Mccormick is a 64 y.o. male who presents for the following: 4 week follow up of tinea cruris/corporis. Taking Terbinafine  250 mg daily, tolerating well. While using Ketoconazole  he broke out in a bright red rash so he D/C and started using CeraVe cream. States improved with CeraVe.   Check red patch on left thigh. Used Ketoconazole  cream and it did not help with itching. Used Eucrisa  and has had no itching.   The following portions of the chart were reviewed this encounter and updated as appropriate: medications, allergies, medical history  Review of Systems:  No other skin or systemic complaints except as noted in HPI or Assessment and Plan.  Objective  Well appearing patient in no apparent distress; mood and affect are within normal limits.  A focused examination was performed of the following areas: Groin, buttocks, thighs  Relevant exam findings are noted in the Assessment and Plan.    Assessment & Plan   Tinea Corporis with Tinea Cruris Tinea Pedis and Tinea Unguium flared in groin and buttocks, left thigh with Majocchi's Granuloma   Chronic and persistent condition with duration or expected duration over one year. Condition is improving with treatment but not currently at goal.  Exam: erythematous patch at L thigh. Mild erythema at gluteal crease.  Thickening of toenails  and scaly feet observed at exam today  Treatment Plan: Continue treatment for Tinea Unguium = Lamisil  250 mg tab as directed, take for additional 2 months.    Terbinafine  Counseling Terbinafine  is an anti-fungal medicine that can be applied to the skin (over the counter) or taken by mouth (prescription) to treat fungal infections. The pill version is often used to treat fungal infections of the nails or scalp. While most people do not have any side effects from taking terbinafine  pills, some possible side effects of the medicine can include taste changes, headache, loss of  smell, vision changes, nausea, vomiting, or diarrhea.   Rare side effects can include irritation of the liver, allergic reaction, or decrease in blood counts (which may show up as not feeling well or developing an infection). If you are concerned about any of these side effects, please stop the medicine and call your doctor, or in the case of an emergency such as feeling very unwell, seek immediate medical care.     Probable Id reaction to toxins from dying fungus  versus  contact dermatitis to ketoconazole  cream. Patient has already discontinued ketoconazole  cream. Rash has been improving. He still has some residual edematous redness in his underwear area but according to patient minimal compared to what it used to be. No particular treatment for this other than Eucrisa  cream for symptoms which have has helped TINEA CRURIS   This Visit - terbinafine  (LAMISIL ) 250 MG tablet - Take 1 tablet (250 mg total) by mouth daily. For tinea corporis and Existing Treatments - ketoconazole  (NIZORAL ) 2 % cream - Apply topically to groin and buttocks for rash daily until clear TINEA CORPORIS   This Visit - terbinafine  (LAMISIL ) 250 MG tablet - Take 1 tablet (250 mg total) by mouth daily. For tinea corporis and Existing Treatments - ketoconazole  (NIZORAL ) 2 % cream - Apply topically to groin and buttocks for rash daily until clear TINEA UNGUIUM   TINEA PEDIS OF BOTH FEET   COUNSELING AND COORDINATION OF CARE   MEDICATION MANAGEMENT   LONG-TERM USE OF HIGH-RISK MEDICATION    Return for Follow Up As Scheduled.  I, Jill Parcell, CMA, am acting as scribe for Alm Rhyme, MD.   Documentation: I have reviewed the above documentation for accuracy and completeness, and I agree with the above.  Alm Rhyme, MD    "

## 2024-08-21 ENCOUNTER — Other Ambulatory Visit: Payer: Self-pay

## 2024-09-05 ENCOUNTER — Other Ambulatory Visit: Payer: Self-pay

## 2024-10-13 ENCOUNTER — Ambulatory Visit: Admitting: Dermatology

## 2025-04-14 ENCOUNTER — Encounter: Admitting: Medical-Surgical
